# Patient Record
Sex: Female | Born: 1950 | Race: Black or African American | Hispanic: No | Marital: Married | Smoking: Never smoker
Health system: Southern US, Community
[De-identification: ages and names within clinical notes are randomized; demographics above are authoritative.]

## PROBLEM LIST (undated history)

## (undated) DIAGNOSIS — J40 Bronchitis, not specified as acute or chronic: Secondary | ICD-10-CM

## (undated) DIAGNOSIS — M1990 Unspecified osteoarthritis, unspecified site: Secondary | ICD-10-CM

## (undated) HISTORY — PX: MOUTH SURGERY: SHX715

## (undated) HISTORY — DX: Unspecified osteoarthritis, unspecified site: M19.90

---

## 2005-06-09 ENCOUNTER — Encounter: Admission: RE | Admit: 2005-06-09 | Discharge: 2005-06-09 | Payer: Self-pay | Admitting: Family Medicine

## 2010-07-24 ENCOUNTER — Encounter
Admission: RE | Admit: 2010-07-24 | Discharge: 2010-07-24 | Payer: Self-pay | Source: Home / Self Care | Attending: Family Medicine | Admitting: Family Medicine

## 2011-11-27 ENCOUNTER — Other Ambulatory Visit: Payer: Self-pay | Admitting: Family Medicine

## 2011-11-27 DIAGNOSIS — M79606 Pain in leg, unspecified: Secondary | ICD-10-CM

## 2011-11-27 DIAGNOSIS — M549 Dorsalgia, unspecified: Secondary | ICD-10-CM

## 2011-12-02 ENCOUNTER — Other Ambulatory Visit: Payer: Self-pay

## 2011-12-04 ENCOUNTER — Ambulatory Visit
Admission: RE | Admit: 2011-12-04 | Discharge: 2011-12-04 | Disposition: A | Discharge status: Home / Self Care | Source: Ambulatory Visit | Attending: Family Medicine | Admitting: Family Medicine

## 2011-12-04 DIAGNOSIS — M79606 Pain in leg, unspecified: Secondary | ICD-10-CM

## 2011-12-04 DIAGNOSIS — M549 Dorsalgia, unspecified: Secondary | ICD-10-CM

## 2011-12-06 ENCOUNTER — Other Ambulatory Visit: Payer: Self-pay

## 2014-10-18 DIAGNOSIS — I10 Essential (primary) hypertension: Secondary | ICD-10-CM | POA: Insufficient documentation

## 2016-01-28 ENCOUNTER — Encounter (HOSPITAL_BASED_OUTPATIENT_CLINIC_OR_DEPARTMENT_OTHER): Payer: Self-pay | Admitting: *Deleted

## 2016-01-28 ENCOUNTER — Emergency Department (HOSPITAL_BASED_OUTPATIENT_CLINIC_OR_DEPARTMENT_OTHER)
Admission: EM | Admit: 2016-01-28 | Discharge: 2016-01-28 | Disposition: A | Discharge status: Home / Self Care | Payer: Medicare HMO | Attending: Emergency Medicine | Admitting: Emergency Medicine

## 2016-01-28 DIAGNOSIS — B029 Zoster without complications: Secondary | ICD-10-CM | POA: Diagnosis not present

## 2016-01-28 DIAGNOSIS — Z79899 Other long term (current) drug therapy: Secondary | ICD-10-CM | POA: Diagnosis not present

## 2016-01-28 DIAGNOSIS — M791 Myalgia: Secondary | ICD-10-CM | POA: Insufficient documentation

## 2016-01-28 DIAGNOSIS — R21 Rash and other nonspecific skin eruption: Secondary | ICD-10-CM | POA: Diagnosis present

## 2016-01-28 HISTORY — DX: Bronchitis, not specified as acute or chronic: J40

## 2016-01-28 MED ORDER — ACYCLOVIR 800 MG PO TABS: 800.0000 mg | ORAL_TABLET | Freq: Every day | ORAL | 0 refills | Status: DC

## 2016-01-28 MED ORDER — HYDROCODONE-ACETAMINOPHEN 5-325 MG PO TABS
2.0000 | ORAL_TABLET | Freq: Once | ORAL | Status: AC
  Administered 2016-01-28: 2 via ORAL
  Filled 2016-01-28: qty 2

## 2016-01-28 MED ORDER — HYDROCODONE-ACETAMINOPHEN 5-325 MG PO TABS: 1.0000 | ORAL_TABLET | Freq: Four times a day (QID) | ORAL | 0 refills | Status: DC | PRN

## 2016-01-28 NOTE — ED Provider Notes (Signed)
[facility] Provider Note   CSN: [id number] Arrival date & time: 01/28/16  1556  First Provider Contact:  First MD Initiated Contact with Patient 01/28/16 1652        By signing my name below, I, Doreatha Martin, attest that this documentation has been prepared under the direction and in the presence of  Donovan Gatchel, PA-C. Electronically Signed: Doreatha Martin, ED Scribe. 01/28/16. 4:58 PM.    History   Chief Complaint Chief Complaint  Patient presents with  . Rash    HPI Ashley Wu is a 65 y.o. female who presents to the Emergency Department complaining of a moderately pruritic and painful rash to the buttock and left leg onset 3 days ago. Pt states she developed pain before the onset of rash. She reports that she has applied hydrocortisone cream with relief of itching, but no relief of pain. She also reports she has tried Meloxicam with some relief of pain. No known sick contacts with similar symptoms. No new soaps, lotions, detergents, foods, animals, plants, medications. She denies fever, chills, nausea, emesis, additional complaints.    The history is provided by the patient. No language interpreter was used.    Past Medical History:  Diagnosis Date  . Bronchitis     There are no active problems to display for this patient.   Past Surgical History:  Procedure Laterality Date  . MOUTH SURGERY      OB History    No data available       Home Medications    Prior to Admission medications   Medication Sig Start Date End Date Taking? Authorizing Provider  albuterol (PROVENTIL HFA;VENTOLIN HFA) 108 (90 Base) MCG/ACT inhaler Inhale 2 puffs into the lungs every 6 (six) hours as needed for wheezing or shortness of breath.   Yes Historical Provider, MD  amLODipine (NORVASC) 2.5 MG tablet Take 2.5 mg by mouth daily.   Yes Historical Provider, MD  budesonide-formoterol (SYMBICORT) 160-4.5 MCG/ACT inhaler Inhale 2 puffs into the lungs 2 (two) times  daily.   Yes Historical Provider, MD    Family History No family history on file.  Social History Social History  Substance Use Topics  . Smoking status: Never Smoker  . Smokeless tobacco: Never Used  . Alcohol use Yes     Allergies   Advil [ibuprofen]; Aspirin; Baker's yeast [yeast]; and Orange fruit [citrus]   Review of Systems Review of Systems  Constitutional: Negative for chills and fever.  Gastrointestinal: Negative for nausea and vomiting.  Musculoskeletal: Positive for myalgias.  Skin: Positive for rash.    Physical Exam Updated Vital Signs BP 140/88 (BP Location: Left Arm)   Pulse 89   Temp 98.3 F (36.8 C) (Oral)   Resp 18   Ht  (1.676 m)   Wt 169 lb (76.7 kg)   SpO2 100%   BMI 27.28 kg/m   Physical Exam  Constitutional: She appears well-developed and well-nourished.  HENT:  Head: Normocephalic.  Eyes: Conjunctivae are normal.  Cardiovascular: Normal rate, regular rhythm and normal heart sounds.   No murmur heard. Pulmonary/Chest: Effort normal and breath sounds normal. No respiratory distress. She has no wheezes. She has no rales.  Lungs CTA bilaterally.   Musculoskeletal: Normal range of motion.  Neurological: She is alert.  Skin: Skin is warm and dry. Rash noted.  There is a rash to the left medial leg that wraps around from the left buttock.  Rash is erythematous and vesicular in a dermatomal pattern.  Psychiatric: She has a normal mood and affect. Her behavior is normal.  Nursing note and vitals reviewed.    ED Treatments / Results  Labs (all labs ordered are listed, but only abnormal results are displayed) Labs Reviewed - No data to display  EKG  EKG Interpretation None       Radiology No results found.  Procedures Procedures (including critical care time)  DIAGNOSTIC STUDIES: Oxygen Saturation is 100% on RA, normal by my interpretation.    COORDINATION OF CARE: 4:56 PM Discussed treatment plan with pt at bedside  which includes Acyclovir and pt agreed to plan.    Medications Ordered in ED Medications - No data to display   Initial Impression / Assessment and Plan / ED Course  I have reviewed the triage vital signs and the nursing notes.  Pertinent labs & imaging results that were available during my care of the patient were reviewed by me and considered in my medical decision making (see chart for details).  Clinical Course    Ashley Wu presents to the ED for evaluation of rash to the left leg.  Rash appears to be in a dermatomal pattern. Appearance most consistent with Shingles.   Patient will be sent home with Acyclovir and pain medication. Patient advised to follow up with PCP. Patient appears stable for discharge at this time. Return precautions discussed and outlined in discharge paperwork. Patient is agreeable to plan.     Final Clinical Impressions(s) / ED Diagnoses   Final diagnoses:  None    New Prescriptions New Prescriptions   No medications on file   I personally performed the services described in this documentation, which was scribed in my presence. The recorded information has been reviewed and is accurate.    Santiago GladHeather Deletha Jaffee, PA-C 01/30/16 2149    Lavera Guiseana Duo Liu, MD 01/31/16 2039

## 2016-01-28 NOTE — ED Triage Notes (Signed)
Patient states she developed lower back pain three days ago.  Shortly after, developed a rash on her right buttock, with radiation down the inner thigh to her ankle.  Describes as itchy initially, now is very painful.  Was told she had shingles.

## 2016-01-28 NOTE — ED Notes (Signed)
PA with pt.

## 2016-04-15 ENCOUNTER — Telehealth (INDEPENDENT_AMBULATORY_CARE_PROVIDER_SITE_OTHER): Payer: Self-pay | Admitting: Orthopedic Surgery

## 2016-04-15 NOTE — Telephone Encounter (Signed)
Patient is requesting a RX for pain medication.  [facility]:  [facility] [address], [place], [place]

## 2016-04-16 ENCOUNTER — Ambulatory Visit (INDEPENDENT_AMBULATORY_CARE_PROVIDER_SITE_OTHER): Payer: Medicare HMO

## 2016-04-16 ENCOUNTER — Encounter (INDEPENDENT_AMBULATORY_CARE_PROVIDER_SITE_OTHER): Payer: Self-pay | Admitting: Orthopedic Surgery

## 2016-04-16 ENCOUNTER — Ambulatory Visit (INDEPENDENT_AMBULATORY_CARE_PROVIDER_SITE_OTHER): Payer: Medicare HMO | Admitting: Orthopedic Surgery

## 2016-04-16 VITALS — BP 115/70 | HR 94 | Temp 97.7°F | Ht 64.0 in | Wt 169.0 lb

## 2016-04-16 DIAGNOSIS — M25562 Pain in left knee: Secondary | ICD-10-CM

## 2016-04-16 MED ORDER — METHOCARBAMOL 500 MG PO TABS: 500.0000 mg | ORAL_TABLET | Freq: Three times a day (TID) | ORAL | 1 refills | Status: DC | PRN

## 2016-04-16 MED ORDER — LIDOCAINE HCL 1 % IJ SOLN
5.0000 mL | INTRAMUSCULAR | Status: AC | PRN
  Administered 2016-04-16: 5 mL

## 2016-04-16 MED ORDER — METHYLPREDNISOLONE ACETATE 40 MG/ML IJ SUSP
40.0000 mg | INTRAMUSCULAR | Status: AC | PRN
  Administered 2016-04-16: 40 mg via INTRA_ARTICULAR

## 2016-04-16 MED ORDER — BUPIVACAINE HCL 0.25 % IJ SOLN
4.0000 mL | INTRAMUSCULAR | Status: AC | PRN
  Administered 2016-04-16: 4 mL via INTRA_ARTICULAR

## 2016-04-16 MED ORDER — OXAPROZIN 600 MG PO TABS: 600.0000 mg | ORAL_TABLET | Freq: Every day | ORAL | 1 refills | Status: DC

## 2016-04-16 NOTE — Progress Notes (Signed)
Office Visit Note  Ashley Mayotte, MD PCP [facility]  Haidynn is a 65 year old patient with long history of left knee pain.  She reports pain globally around the knee with occasional locking and giving way.  She has been seen in the past by Dr. Jorge Wu in an injection to give her relief.  She hasn't been seen since last year.  Plan at that time was observation with possible injection and/or MRI scanning if her symptoms persisted.  She is currently symptomatic.  Denies any fever or chills. Patient complains of left knee pain, anterior and posterior.  NKI, Onset x 3 weeks. Gives way on her, difficulty with stairs, cannot sleep well.  Maybe 1-2 hrs per night.  Tightness/swelling.  Took daypro and robaxin with no relief, even tried gabapentin that she had for shingles in this left knee- shingles was dx August 4,2017.  This is resolved per patient.     Patient: Ashley Wu           Date of Birth: [birth year]           MRN: [id number] Visit Date: 04/16/2016              Requested by: No referring provider defined for this encounter. PCP:   Assessment & Plan: Visit Diagnoses:  1. Acute pain of left knee     Plan: Impression is left knee effusion with patellofemoral arthritis plan is aspiration injection today we did get about 5 or 6 mL at which is negligible real issue with her is whether not she has something this arthroscopically Ashley Wu and her knee or if this pain and effusion is primarily from patellofemoral arthritis landing is to do the injection try Robaxin and Daypro which is sent to the pharmacy today next step will be MRI scanning which will be ordered if she wants to get that done if these interventions don't help.  She'll call for that next step will see her back as needed  Follow-Up Instructions: Return if symptoms worsen or fail to improve.   Orders:  Orders Placed This Encounter  Procedures  . Large Joint Injection/Arthrocentesis  . XR KNEE 3 VIEW LEFT    Meds ordered this encounter  Medications  . methocarbamol (ROBAXIN) 500 MG tablet    Sig: Take 1 tablet (500 mg total) by mouth every 8 (eight) hours as needed for muscle spasms.    Dispense:  30 tablet    Refill:  1  . oxaprozin (DAYPRO) 600 MG tablet    Sig: Take 1 tablet (600 mg total) by mouth daily.    Dispense:  30 tablet    Refill:  1      Procedures: Large Joint Inj Date/Time: 04/16/2016 4:41 PM Performed by: Ashley Wu Authorized by: Ashley Wu   Consent Given by:  Patient Site marked: the procedure site was marked   Timeout: prior to procedure the correct patient, procedure, and site was verified   Indications:  Pain, joint swelling and diagnostic evaluation Location:  Knee Site:  L knee Prep: patient was prepped and draped in usual sterile fashion   Needle Size:  18 G Needle Length:  1.5 inches Approach:  Superolateral Ultrasound Guidance: No   Fluoroscopic Guidance: No  no Medications:  4 mL bupivacaine 0.25 %; 5 mL lidocaine 1 %; 40 mg methylPREDNISolone acetate 40 MG/ML Aspiration Attempted: Yes   Aspirate amount (mL):  5 Patient tolerance:  Patient tolerated the procedure well with no immediate complications  Clinical Data: No additional findings.   Subjective: Chief Complaint  Patient presents with  . Left Knee - Pain, Joint Swelling    HPI  Review of Systems all systems reviewed and negative except as those that relate to history of present illness denies any fever and chills   Objective: Vital Signs: BP 115/70   Pulse 94   Temp 97.7 F (36.5 C)   Ht 5\' 4"  (1.626 m)   Wt 169 lb (76.7 kg)   BMI 29.01 kg/m   Physical Exam  Constitutional: She appears well-developed and well-nourished.  HENT:  Head: Normocephalic.  Eyes: EOM are normal.  Neck: Normal range of motion.  Cardiovascular: Normal rate.   Pulmonary/Chest: Effort normal.  Neurological: She is alert.  Skin: Skin is warm.  Psychiatric: She has a  normal mood and affect.   Ortho Exam Left knee is examined she has excellent range of motion trace effusion no warmth collateral crucial ligaments are stable pedal pulses palpable no groin pain with internal/external rotation leg no nerve root tension signs she does have patellofemoral crepitus Specialty Comments:  No specialty comments available.  Imaging: Xr Knee 3 View Left  Result Date: 04/16/2016 3 views left knee order and obtained shows maintenance of the joint space medially and laterally but significant patella femoral wear is present.  No other bony lesions present within the femur or tibia alignment is slight varus    PMFS History: There are no active problems to display for this patient.  Past Medical History:  Diagnosis Date  . Arthritis   . Bronchitis   . Osteoarthritis     Family History  Problem Relation Age of Onset  . Cancer Father     Past Surgical History:  Procedure Laterality Date  . MOUTH SURGERY     Social History   Occupational History  . Not on file.   Social History Main Topics  . Smoking status: Never Smoker  . Smokeless tobacco: Never Used  . Alcohol use Yes  . Drug use: No  . Sexual activity: Not on file

## 2016-04-16 NOTE — Telephone Encounter (Signed)
IC pt and advised, workedin this afternoon to see Ashley Wu, left knee pain

## 2016-04-26 ENCOUNTER — Encounter (INDEPENDENT_AMBULATORY_CARE_PROVIDER_SITE_OTHER): Payer: Self-pay | Admitting: Orthopedic Surgery

## 2016-04-26 ENCOUNTER — Ambulatory Visit (INDEPENDENT_AMBULATORY_CARE_PROVIDER_SITE_OTHER): Payer: Medicare HMO | Admitting: Orthopedic Surgery

## 2016-04-26 DIAGNOSIS — M25562 Pain in left knee: Secondary | ICD-10-CM | POA: Diagnosis not present

## 2016-04-26 DIAGNOSIS — G8929 Other chronic pain: Secondary | ICD-10-CM

## 2016-04-26 NOTE — Progress Notes (Signed)
   Office Visit Note   Patient: Ashley Wu           Date of Birth: 04-30-1951           MRN: [id number] Visit Date: 04/26/2016 Requested by: No referring provider defined for this encounter. PCP: No PCP Per Patient  Subjective: Chief Complaint  Patient presents with  . Left Knee - Pain, Follow-up    HPI Ashley Wu 65 year old patient with left knee pain.  Been ongoing for several years.  Taking Daypro and Robaxin.  She's had minimal improvement.  She did have a shin without break in August.  Been hurting her since then.  She has had injections in the past.  Has fairly minimal arthritis in the knee on plain radiographs.  His not have a family history of gout.              Review of Systems all systems reviewed negative as they relate to the chief complaint the fevers or chills   Assessment & Plan: Visit Diagnoses:  1. Chronic pain of left knee     Plan: Left knee pain and effusion global pain no more arthritis on plain radiograph radiographs failure of conservative management with injections and and oral anti-inflammatories.  Need MRI scan to determine if this is a problem that can be treated arthroscopically or she will need a bigger procedure such as knee replacement  Follow-Up Instructions: Return for after MRI.   Orders:  Orders Placed This Encounter  Procedures  . MR Knee Left w/o contrast   No orders of the defined types were placed in this encounter.     Procedures: No procedures performed   Clinical Data: No additional findings.  Objective: Vital Signs: There were no vitals taken for this visit.  Physical Exam  Constitutional: She appears well-developed.  HENT:  Head: Normocephalic.  Eyes: EOM are normal.  Neck: Normal range of motion.  Cardiovascular: Normal rate.   Pulmonary/Chest: Effort normal.  Neurological: She is alert.  Skin: Skin is warm.  Psychiatric: She has a normal mood and affect.    Ortho Exam examination the left knee  demonstrates effusion full range of motion intact since mechanism mild patella femoral crepitus" crucial ligaments palpable pedal pulses no groin pain interlocks rotation leg no other masses lymph adenopathy or skin vision left knee region right knee has no effusion and similar exam  Specialty Comments:  No specialty comments available.  Imaging: No results found.   PMFS History: There are no active problems to display for this patient.  Past Medical History:  Diagnosis Date  . Arthritis   . Bronchitis   . Osteoarthritis     Family History  Problem Relation Age of Onset  . Cancer Father     Past Surgical History:  Procedure Laterality Date  . MOUTH SURGERY     Social History   Occupational History  . Not on file.   Social History Main Topics  . Smoking status: Never Smoker  . Smokeless tobacco: Never Used  . Alcohol use Yes  . Drug use: No  . Sexual activity: Not on file

## 2016-05-11 ENCOUNTER — Ambulatory Visit
Admission: RE | Admit: 2016-05-11 | Discharge: 2016-05-11 | Disposition: A | Discharge status: Home / Self Care | Payer: Medicare HMO | Source: Ambulatory Visit | Attending: Orthopedic Surgery | Admitting: Orthopedic Surgery

## 2016-05-11 DIAGNOSIS — G8929 Other chronic pain: Secondary | ICD-10-CM

## 2016-05-11 DIAGNOSIS — M25562 Pain in left knee: Principal | ICD-10-CM

## 2016-05-15 ENCOUNTER — Encounter (INDEPENDENT_AMBULATORY_CARE_PROVIDER_SITE_OTHER): Payer: Self-pay | Admitting: Orthopedic Surgery

## 2016-05-15 ENCOUNTER — Ambulatory Visit (INDEPENDENT_AMBULATORY_CARE_PROVIDER_SITE_OTHER): Payer: Medicare HMO | Admitting: Orthopedic Surgery

## 2016-05-15 DIAGNOSIS — M1712 Unilateral primary osteoarthritis, left knee: Secondary | ICD-10-CM

## 2016-05-15 DIAGNOSIS — S83262D Peripheral tear of lateral meniscus, current injury, left knee, subsequent encounter: Secondary | ICD-10-CM | POA: Diagnosis not present

## 2016-05-15 MED ORDER — HYDROCODONE-ACETAMINOPHEN 5-325 MG PO TABS: 1.0000 | ORAL_TABLET | Freq: Every day | ORAL | 0 refills | Status: DC

## 2016-05-15 NOTE — Progress Notes (Signed)
   Office Visit Note   Patient: Ashley DawesDarlene Wilson-Mitchell           Date of Birth: 12/17/1950           MRN: [id number] Visit Date: 05/15/2016 Requested by: No referring provider defined for this encounter. PCP: No PCP Per Patient  Subjective: Chief Complaint  Patient presents with  . Left Knee - Pain    HPI Agustin CreeDarlene is a 65 year old female with left knee pain here to follow-up MRI scan.  She's used Daypro and Robaxin without help.  She is currently not working.  Tried ice and heat which has not helped.  She tried tramadol, Tylenol 3 that also has not helped.  Describes pain primarily in the anterior aspect of the knee radiating up the thigh.  Denies any groin pain.              Review of Systems All systems reviewed are negative as they relate to the chief complaint within the history of present illness.  Patient denies  fevers or chills.    Assessment & Plan: Visit Diagnoses:  1. Primary osteoarthritis of left knee     Plan: Impression is left knee pain with lateral meniscal tear mild arthritis in the lateral compartment plan 1 tablet prescription for pain medicine given she's going to consider arthroscopic intervention at the beginning of the year.  Nonweightbearing quad straightening exercises encouraged.  She'll follow up in beginning of the year if she wants to consider arthroscopic intervention.  I did tell her that are not really doing chronic long-term pain medicine.  Follow-Up Instructions: No Follow-up on file.   Orders:  No orders of the defined types were placed in this encounter.  No orders of the defined types were placed in this encounter.     Procedures: No procedures performed   Clinical Data: No additional findings.  Objective: Vital Signs: There were no vitals taken for this visit.  Physical Exam  Constitutional: She appears well-developed.  HENT:  Head: Normocephalic.  Eyes: EOM are normal.  Neck: Normal range of motion.  Cardiovascular: Normal  rate.   Pulmonary/Chest: Effort normal.  Neurological: She is alert.  Skin: Skin is warm.  Psychiatric: She has a normal mood and affect.    Ortho Exam examination of the left knee demonstrates good range of motion moderate effusion stable collateral crucial ligaments palpable pedal pulses no groin pain with internal/external rotation of the leg intact extensor mechanism  Specialty Comments:  No specialty comments available.  Imaging: No results found.   PMFS History: There are no active problems to display for this patient.  Past Medical History:  Diagnosis Date  . Arthritis   . Bronchitis   . Osteoarthritis     Family History  Problem Relation Age of Onset  . Cancer Father     Past Surgical History:  Procedure Laterality Date  . MOUTH SURGERY     Social History   Occupational History  . Not on file.   Social History Main Topics  . Smoking status: Never Smoker  . Smokeless tobacco: Never Used  . Alcohol use Yes  . Drug use: No  . Sexual activity: Not on file

## 2016-08-07 ENCOUNTER — Emergency Department (HOSPITAL_BASED_OUTPATIENT_CLINIC_OR_DEPARTMENT_OTHER)
Admission: EM | Admit: 2016-08-07 | Discharge: 2016-08-07 | Disposition: A | Discharge status: Home / Self Care | Payer: Medicare HMO | Attending: Emergency Medicine | Admitting: Emergency Medicine

## 2016-08-07 ENCOUNTER — Encounter (HOSPITAL_BASED_OUTPATIENT_CLINIC_OR_DEPARTMENT_OTHER): Payer: Self-pay

## 2016-08-07 ENCOUNTER — Emergency Department (HOSPITAL_BASED_OUTPATIENT_CLINIC_OR_DEPARTMENT_OTHER): Payer: Medicare HMO

## 2016-08-07 DIAGNOSIS — J9801 Acute bronchospasm: Secondary | ICD-10-CM

## 2016-08-07 DIAGNOSIS — J069 Acute upper respiratory infection, unspecified: Secondary | ICD-10-CM | POA: Diagnosis not present

## 2016-08-07 DIAGNOSIS — Z79899 Other long term (current) drug therapy: Secondary | ICD-10-CM | POA: Diagnosis not present

## 2016-08-07 DIAGNOSIS — R05 Cough: Secondary | ICD-10-CM | POA: Diagnosis present

## 2016-08-07 MED ORDER — IPRATROPIUM-ALBUTEROL 0.5-2.5 (3) MG/3ML IN SOLN
RESPIRATORY_TRACT | Status: AC
  Administered 2016-08-07: 3 mL
  Filled 2016-08-07: qty 3

## 2016-08-07 MED ORDER — PREDNISONE 20 MG PO TABS
ORAL_TABLET | ORAL | 0 refills | Status: DC
Start: 2016-08-07 — End: 2017-06-15

## 2016-08-07 MED ORDER — ALBUTEROL SULFATE (2.5 MG/3ML) 0.083% IN NEBU
INHALATION_SOLUTION | RESPIRATORY_TRACT | Status: AC
  Administered 2016-08-07: 2.5 mg
  Filled 2016-08-07: qty 3

## 2016-08-07 MED ORDER — METHYLPREDNISOLONE SODIUM SUCC 125 MG IJ SOLR
125.0000 mg | Freq: Once | INTRAMUSCULAR | Status: AC
  Administered 2016-08-07: 125 mg via INTRAMUSCULAR
  Filled 2016-08-07: qty 2

## 2016-08-07 MED ORDER — LORATADINE 10 MG PO TABS: 10.0000 mg | ORAL_TABLET | Freq: Every day | ORAL | 0 refills | Status: DC

## 2016-08-07 MED FILL — predniSONE 20 MG TABS: 20 | 5 days supply | Qty: 11 | Fill #0

## 2016-08-07 MED FILL — LORATADINE 10 MG TABLET: 10 | 100 days supply | Qty: 100 | Fill #0

## 2016-08-07 NOTE — ED Triage Notes (Addendum)
C/o prod cough, SOB x 3-4 days-dx with sinus infection rx amoxil-NAD-steady gait-pt in no resp distress-RT reports wheezing heard-plan for neb in tx area

## 2016-08-07 NOTE — ED Provider Notes (Signed)
[facility]   CSN: [id number] Arrival date & time: 08/07/16  1231     History   Chief Complaint Chief Complaint  Patient presents with  . Cough    HPI Ashley Wu is a 66 y.o. female.  HPI Patient presents with shortness of breath, wheezing and cough productive of clear sputum for the past 3 days. States she's had low-grade fever. Recently treated for sinus infection. Continues to have nasal congestion but denies any sinus pressure. Mild sore throat. No lower extremity swelling or pain. No chest pain. She's been using her inhaler at home with little relief. Past Medical History:  Diagnosis Date  . Arthritis   . Bronchitis   . Osteoarthritis     There are no active problems to display for this patient.   Past Surgical History:  Procedure Laterality Date  . MOUTH SURGERY      OB History    No data available       Home Medications    Prior to Admission medications   Medication Sig Start Date End Date Taking? Authorizing Provider  fluticasone (FLONASE) 50 MCG/ACT nasal spray Place into both nostrils daily.   Yes Historical Provider, MD  albuterol (PROVENTIL HFA;VENTOLIN HFA) 108 (90 Base) MCG/ACT inhaler Inhale 2 puffs into the lungs every 6 (six) hours as needed for wheezing or shortness of breath.    Historical Provider, MD  amLODipine (NORVASC) 2.5 MG tablet Take 2.5 mg by mouth daily.    Historical Provider, MD  budesonide-formoterol (SYMBICORT) 160-4.5 MCG/ACT inhaler Inhale 2 puffs into the lungs 2 (two) times daily.    Historical Provider, MD  HYDROcodone-acetaminophen (NORCO/VICODIN) 5-325 MG tablet Take 1 tablet by mouth daily. 05/15/16   Cammy Copa, MD  loratadine (CLARITIN) 10 MG tablet Take 1 tablet (10 mg total) by mouth daily. 08/07/16   Loren Racer, MD  methocarbamol (ROBAXIN) 500 MG tablet Take 1 tablet (500 mg total) by mouth every 8 (eight) hours as needed for muscle spasms. 04/16/16   Cammy Copa, MD  oxaprozin (DAYPRO) 600 MG tablet Take 1 tablet (600 mg total) by mouth daily. 04/16/16   Cammy Copa, MD  predniSONE (DELTASONE) 20 MG tablet 3 tabs po day one, then 2 po daily x 4 days 08/07/16   Loren Racer, MD    Family History Family History  Problem Relation Age of Onset  . Cancer Father     Social History Social History  Substance Use Topics  . Smoking status: Never Smoker  . Smokeless tobacco: Never Used  . Alcohol use Yes     Comment: occ     Allergies   Advil [ibuprofen]; Aspirin; Baker's yeast [yeast]; and Orange fruit [citrus]   Review of Systems Review of Systems  Constitutional: Positive for fatigue and fever. Negative for chills.  HENT: Positive for congestion, rhinorrhea and sore throat. Negative for sinus pressure.   Respiratory: Positive for cough, shortness of breath and wheezing.   Cardiovascular: Negative for chest pain, palpitations and leg swelling.  Gastrointestinal: Negative for abdominal pain, constipation, diarrhea, nausea and vomiting.  Genitourinary: Negative for dysuria, flank pain and frequency.  Musculoskeletal: Negative for back pain, myalgias, neck pain and neck stiffness.  Skin: Negative for rash and wound.  Neurological: Negative for dizziness, weakness, light-headedness, numbness and headaches.  All other systems reviewed and are negative.    Physical Exam Updated Vital Signs BP 159/87 (BP Location: Left Arm)   Pulse 101   Temp 99.2  F (37.3 C) (Oral)   Resp 20   Ht 5\' 6"  (1.676 m)   Wt 168 lb (76.2 kg)   SpO2 98%   BMI 27.12 kg/m   Physical Exam  Constitutional: She is oriented to person, place, and time. She appears well-developed and well-nourished. No distress.  HENT:  Head: Normocephalic and atraumatic.  Mouth/Throat: Oropharynx is clear and moist.  Bilateral nasal mucosal edema. Mildly erythematous oropharynx.  Eyes: EOM are normal. Pupils are equal, round, and reactive to light.  Neck: Normal  range of motion. Neck supple.  Cardiovascular: Normal rate and regular rhythm.   Pulmonary/Chest: Effort normal. No stridor. She has wheezes (Expiratory wheezing in all lung fields).  Abdominal: Soft. Bowel sounds are normal. There is no tenderness. There is no rebound and no guarding.  Musculoskeletal: Normal range of motion. She exhibits no edema or tenderness.  No lower extremity swelling or asymmetry.  Neurological: She is alert and oriented to person, place, and time.  Moves all extremities without deficit. Sensation fully intact.  Skin: Skin is warm and dry. Capillary refill takes less than 2 seconds. No rash noted. No erythema.  Psychiatric: She has a normal mood and affect. Her behavior is normal.  Nursing note and vitals reviewed.    ED Treatments / Results  Labs (all labs ordered are listed, but only abnormal results are displayed) Labs Reviewed - No data to display  EKG  EKG Interpretation None       Radiology Dg Chest 2 View  Result Date: 08/07/2016 CLINICAL DATA:  Three days of intermittent shortness of breath with cough and chest congestion and low-grade fever. The patient also reports right lower back and abdominal discomfort. History of hypertension, bronchitis, nonsmoker. EXAM: CHEST  2 VIEW COMPARISON:  None in PACs FINDINGS: The lungs are mildly hyperinflated with hemidiaphragm flattening. There is no focal infiltrate. The interstitial markings are coarse. The heart and pulmonary vascularity are normal. The mediastinum is normal in width. There is mild multilevel degenerative disc disease of the thoracic spine with gentle S shaped thoracolumbar scoliosis. IMPRESSION: Chronic bronchitic changes. No pneumonia, CHF, nor other acute cardiopulmonary abnormality. Electronically Signed   By: Kriston Mckinnie  [place] M.D.   On: 08/07/2016 13:20    Procedures Procedures (including critical care time)  Medications Ordered in ED Medications  albuterol (PROVENTIL) (2.5 MG/3ML) 0.083%  nebulizer solution (2.5 mg  Given 08/07/16 1249)  ipratropium-albuterol (DUONEB) 0.5-2.5 (3) MG/3ML nebulizer solution (3 mLs  Given 08/07/16 1249)  methylPREDNISolone sodium succinate (SOLU-MEDROL) 125 mg/2 mL injection 125 mg (125 mg Intramuscular Given 08/07/16 1319)     Initial Impression / Assessment and Plan / ED Course  I have reviewed the triage vital signs and the nursing notes.  Pertinent labs & imaging results that were available during my care of the patient were reviewed by me and considered in my medical decision making (see chart for details).     Patient states for much better after breathing treatment. Chest x-ray without evidence of pneumonia. Wheezing has resolved on repeat exam. We'll discharge home with short course of steroids and started on Claritin. Advised to follow-up with her primary physician. Return precautions given.  Final Clinical Impressions(s) / ED Diagnoses   Final diagnoses:  Upper respiratory tract infection, unspecified type  Bronchospasm    New Prescriptions New Prescriptions   LORATADINE (CLARITIN) 10 MG TABLET    Take 1 tablet (10 mg total) by mouth daily.   PREDNISONE (DELTASONE) 20 MG TABLET    3  tabs po day one, then 2 po daily x 4 days     Loren Racer, MD 08/07/16 [phone]

## 2017-06-15 ENCOUNTER — Emergency Department (HOSPITAL_BASED_OUTPATIENT_CLINIC_OR_DEPARTMENT_OTHER)
Admission: EM | Admit: 2017-06-15 | Discharge: 2017-06-15 | Disposition: A | Discharge status: Home / Self Care | Payer: Medicare HMO | Attending: Emergency Medicine | Admitting: Emergency Medicine

## 2017-06-15 ENCOUNTER — Other Ambulatory Visit: Payer: Self-pay

## 2017-06-15 ENCOUNTER — Emergency Department (HOSPITAL_BASED_OUTPATIENT_CLINIC_OR_DEPARTMENT_OTHER): Payer: Medicare HMO

## 2017-06-15 ENCOUNTER — Encounter (HOSPITAL_BASED_OUTPATIENT_CLINIC_OR_DEPARTMENT_OTHER): Payer: Self-pay | Admitting: Emergency Medicine

## 2017-06-15 DIAGNOSIS — Z79899 Other long term (current) drug therapy: Secondary | ICD-10-CM | POA: Insufficient documentation

## 2017-06-15 DIAGNOSIS — R062 Wheezing: Secondary | ICD-10-CM | POA: Diagnosis not present

## 2017-06-15 DIAGNOSIS — R059 Cough, unspecified: Secondary | ICD-10-CM

## 2017-06-15 DIAGNOSIS — R05 Cough: Secondary | ICD-10-CM | POA: Diagnosis present

## 2017-06-15 MED ORDER — PREDNISONE 50 MG PO TABS
60.0000 mg | ORAL_TABLET | Freq: Once | ORAL | Status: AC
  Administered 2017-06-15: 60 mg via ORAL
  Filled 2017-06-15: qty 1

## 2017-06-15 MED ORDER — PREDNISONE 20 MG PO TABS: 40.0000 mg | ORAL_TABLET | Freq: Every day | ORAL | 0 refills | Status: AC

## 2017-06-15 MED ORDER — IPRATROPIUM-ALBUTEROL 0.5-2.5 (3) MG/3ML IN SOLN
3.0000 mL | Freq: Once | RESPIRATORY_TRACT | Status: AC
  Administered 2017-06-15: 3 mL via RESPIRATORY_TRACT
  Filled 2017-06-15: qty 3

## 2017-06-15 MED ORDER — IPRATROPIUM-ALBUTEROL 0.5-2.5 (3) MG/3ML IN SOLN
RESPIRATORY_TRACT | Status: AC
  Administered 2017-06-15: 3 mL
  Filled 2017-06-15: qty 3

## 2017-06-15 MED ORDER — ALBUTEROL (5 MG/ML) CONTINUOUS INHALATION SOLN
INHALATION_SOLUTION | RESPIRATORY_TRACT | Status: AC
  Administered 2017-06-15: 2.5 mg
  Filled 2017-06-15: qty 0.5

## 2017-06-15 NOTE — ED Provider Notes (Signed)
[facility] EMERGENCY DEPARTMENT Provider Note   CSN: [id number] Arrival date & time: 06/15/17  1218     History   Chief Complaint Chief Complaint  Patient presents with  . Cough    HPI Ashley Wu is a 66 y.o. female a history of bronchitis presents today for evaluation of cough.  She reports that she has had increased cough for the past month however last night began to have significantly increased shortness of breath.  She reports that she attempted a breathing treatment at home last night, and then again this morning without significant relief.  She denies ever smoking.  She reports that she takes Claritin every day for allergies.  Denies any nasal congestion or sore throat.  No fevers or chills.  Denies any chest pains.  HPI  Past Medical History:  Diagnosis Date  . Arthritis   . Bronchitis   . Osteoarthritis     There are no active problems to display for this patient.   Past Surgical History:  Procedure Laterality Date  . MOUTH SURGERY      OB History    No data available       Home Medications    Prior to Admission medications   Medication Sig Start Date End Date Taking? Authorizing Provider  albuterol (PROVENTIL HFA;VENTOLIN HFA) 108 (90 Base) MCG/ACT inhaler Inhale 2 puffs into the lungs every 6 (six) hours as needed for wheezing or shortness of breath.    [provider]  amLODipine (NORVASC) 2.5 MG tablet Take 2.5 mg by mouth daily.    [provider]  budesonide-formoterol (SYMBICORT) 160-4.5 MCG/ACT inhaler Inhale 2 puffs into the lungs 2 (two) times daily.    [provider]  fluticasone (FLONASE) 50 MCG/ACT nasal spray Place into both nostrils daily.    [provider]  HYDROcodone-acetaminophen (NORCO/VICODIN) 5-325 MG tablet Take 1 tablet by mouth daily. 05/15/16   Cammy Copaean, Gregory Scott, MD  loratadine (CLARITIN) 10 MG tablet Take 1 tablet (10 mg total) by mouth daily. 08/07/16   Loren RacerYelverton,  David, MD  methocarbamol (ROBAXIN) 500 MG tablet Take 1 tablet (500 mg total) by mouth every 8 (eight) hours as needed for muscle spasms. 04/16/16   Cammy Copaean, Gregory Scott, MD  oxaprozin (DAYPRO) 600 MG tablet Take 1 tablet (600 mg total) by mouth daily. 04/16/16   Cammy Copaean, Gregory Scott, MD  predniSONE (DELTASONE) 20 MG tablet Take 2 tablets (40 mg total) by mouth daily for 4 days. 06/16/17 06/20/17  Cristina GongHammond, Elizabeth W, PA-C    Family History Family History  Problem Relation Age of Onset  . Cancer Father     Social History Social History   Tobacco Use  . Smoking status: Never Smoker  . Smokeless tobacco: Never Used  Substance Use Topics  . Alcohol use: Yes    Comment: occ  . Drug use: No     Allergies   Advil [ibuprofen]; Aspirin; Baker's yeast [yeast]; and Orange fruit [citrus]   Review of Systems Review of Systems  Constitutional: Negative for chills and fever.  HENT: Negative for ear pain and sore throat.   Eyes: Negative for pain and visual disturbance.  Respiratory: Positive for cough, shortness of breath and wheezing.   Cardiovascular: Negative for chest pain and palpitations.  Gastrointestinal: Negative for abdominal pain and vomiting.  Genitourinary: Negative for dysuria and hematuria.  Musculoskeletal: Negative for arthralgias and back pain.  Skin: Negative for color change and rash.  Neurological: Negative for seizures and syncope.  All other systems reviewed and are negative.    Physical Exam Updated Vital Signs BP (!) 147/95 (BP Location: Left Arm)   Pulse (!) 102   Temp 98.1 F (36.7 C) (Oral)   Resp (!) 26   Ht 5' 6.5" (1.689 m)   Wt 74.4 kg (164 lb)   SpO2 96%   BMI 26.07 kg/m   Physical Exam  Constitutional: She is oriented to person, place, and time. She appears well-developed and well-nourished. No distress.  HENT:  Head: Normocephalic and atraumatic.  Mouth/Throat: Oropharynx is clear and moist.  Eyes: Conjunctivae are normal.  Neck:  Neck supple.  Cardiovascular: Normal rate and regular rhythm.  No murmur heard. Pulmonary/Chest: Effort normal. No stridor. No respiratory distress. She has wheezes (Diffuse expiratory wheezes throughout all lung fields.  Breath sounds are slightly diminished diffusely.).  Abdominal: Soft. There is no tenderness.  Musculoskeletal: She exhibits no edema.  Neurological: She is alert and oriented to person, place, and time.  Skin: Skin is warm and dry.  Psychiatric: She has a normal mood and affect.  Nursing note and vitals reviewed.    ED Treatments / Results  Labs (all labs ordered are listed, but only abnormal results are displayed) Labs Reviewed - No data to display  EKG  EKG Interpretation None       Radiology Dg Chest 2 View  Result Date: 06/15/2017 CLINICAL DATA:  Cough for 1 day EXAM: CHEST  2 VIEW COMPARISON:  08/07/2016 FINDINGS: Heart and mediastinal contours are within normal limits. No focal opacities or effusions. No acute bony abnormality. Mild chronic peribronchial thickening. IMPRESSION: Chronic bronchitic changes.  No active disease. Electronically Signed   By: Charlett NoseKevin  Dover M.D.   On: 06/15/2017 12:56    Procedures Procedures (including critical care time)  Medications Ordered in ED Medications  albuterol (PROVENTIL, VENTOLIN) (5 MG/ML) 0.5% continuous inhalation solution (2.5 mg  Given 06/15/17 1234)  ipratropium-albuterol (DUONEB) 0.5-2.5 (3) MG/3ML nebulizer solution (3 mLs  Given 06/15/17 1234)  ipratropium-albuterol (DUONEB) 0.5-2.5 (3) MG/3ML nebulizer solution 3 mL (3 mLs Nebulization Given 06/15/17 1307)  predniSONE (DELTASONE) tablet 60 mg (60 mg Oral Given 06/15/17 1307)     Initial Impression / Assessment and Plan / ED Course  I have reviewed the triage vital signs and the nursing notes.  Pertinent labs & imaging results that were available during my care of the patient were reviewed by me and considered in my medical decision making (see chart  for details).  Clinical Course as of Jun 15 1634  Sun Jun 15, 2017  1342 Patient reevaluated after second DuoNeb, she is still having end expiratory wheezes with mild rhonchi.  Airflow is increased.  She reports that she feels like she is breathing at her normal, however I am able to hear her wheezing without use of a stethoscope.  Will ambulate with pulse ox.  [EH]    Clinical Course User Index [EH] Cristina GongHammond, Elizabeth W, PA-C   Ashley Wu presents for evaluation of cough, wheezing, shortness of breath consistent with bronchitis.  Chest x-ray was obtained without acute abnormality.  She improved after DuoNeb's, however still sounded wheezy.  Despite sounding wheezy she reported that she felt well.  She was ambulated in the department and maintained her oxygen saturations.  She was offered additional nebulizer treatments, based on her lung sounds, however declined stating that she feels better.  Was given a dose of prednisone while in the emergency room and given a prescription for 4 more  days of prednisone.  She was given return precautions, and stated her understanding.  She reports that she has a nebulizer machine at home along with adequate albuterol treatments.  She was seen as a shared visit with Dr. Particia Nearing who evaluated the patient and agreed with my plan.   Final Clinical Impressions(s) / ED Diagnoses   Final diagnoses:  Cough  Wheezing    ED Discharge Orders        Ordered    predniSONE (DELTASONE) 20 MG tablet  Daily     06/15/17 1414       Cristina Gong, [place] 06/15/17 1639    Jacalyn Lefevre, MD 06/19/17 1536

## 2017-06-15 NOTE — Discharge Instructions (Signed)
Please follow up with your doctor.  If your symptoms worsen or return then please return to the emergency room.

## 2017-06-15 NOTE — ED Notes (Signed)
Patient ambulated in the hallway with pulse ox - Pulse ox WNL - 96-97

## 2017-06-15 NOTE — ED Triage Notes (Signed)
Patient states that she has had SOB and cough with bronchitis today  - she reports that she has had a cough for the last month

## 2018-02-04 ENCOUNTER — Encounter (INDEPENDENT_AMBULATORY_CARE_PROVIDER_SITE_OTHER): Payer: Self-pay | Admitting: Orthopedic Surgery

## 2018-02-04 ENCOUNTER — Ambulatory Visit (INDEPENDENT_AMBULATORY_CARE_PROVIDER_SITE_OTHER): Payer: Medicare HMO | Admitting: Orthopedic Surgery

## 2018-02-04 ENCOUNTER — Ambulatory Visit (INDEPENDENT_AMBULATORY_CARE_PROVIDER_SITE_OTHER): Payer: Medicare HMO

## 2018-02-04 DIAGNOSIS — M25562 Pain in left knee: Secondary | ICD-10-CM

## 2018-02-04 MED ORDER — METHOCARBAMOL 750 MG PO TABS: 750.0000 mg | ORAL_TABLET | Freq: Two times a day (BID) | ORAL | 0 refills | Status: DC

## 2018-02-04 NOTE — Progress Notes (Signed)
Office Visit Note   Patient: Ashley Wu           Date of Birth: 12/01/1950           MRN: [id number] Visit Date: 02/04/2018 Requested by: Angelica ChessmanAguiar, Rafaela M, MD [address] [place], [place] [id number] PCP: Angelica ChessmanAguiar, Rafaela M, MD  Subjective: Chief Complaint  Patient presents with  . Left Knee - Pain    HPI: Ashley Wu is a patient with left knee pain.  She describes locking and soreness.  Is been flared up for 2-1/2 weeks.  She had been taking Robaxin but does not have any left.  She has a known meniscal tear described in 2017.  Had an aspiration and injection of the knee at that time which helped.  She is also taking Neurontin.  Denies any interval history of injury.              ROS: All systems reviewed are negative as they relate to the chief complaint within the history of present illness.  Patient denies  fevers or chills.   Assessment & Plan: Visit Diagnoses:  1. Left knee pain, unspecified chronicity     Plan: Impression is left knee pain with locking with not much in the way of arthritis on plain radiographs.  Plan is to refill Robaxin and to repeat the aspiration and injection of the left knee.  If this fails to relieve relieve her symptoms we may need to consider arthroscopic intervention.  Follow-Up Instructions: No follow-ups on file.   Orders:  Orders Placed This Encounter  Procedures  . XR Knee 1-2 Views Left   No orders of the defined types were placed in this encounter.     Procedures: No procedures performed   Clinical Data: No additional findings.  Objective: Vital Signs: There were no vitals taken for this visit.  Physical Exam:   Constitutional: Patient appears well-developed HEENT:  Head: Normocephalic Eyes:EOM are normal Neck: Normal range of motion Cardiovascular: Normal rate Pulmonary/chest: Effort normal Neurologic: Patient is alert Skin: Skin is warm Psychiatric: Patient has normal mood and affect    Ortho  Exam: Ortho exam demonstrates full active and passive range of motion of the right knee and left knee.  Mild effusion in the left knee is present with mild medial joint line tenderness.  Collateral cruciate ligaments are stable and range of motion is nearly full.  Pedal pulses palpable.  No other masses lymphadenopathy or skin changes noted in that left knee region.  Specialty Comments:  No specialty comments available.  Imaging: Xr Knee 1-2 Views Left  Result Date: 02/04/2018 AP lateral left knee reviewed.  Joint spaces maintained in all 3 compartments.  No significant malalignment or effusion present.  Only mild degenerative changes noted in the medial and lateral plateau region.    PMFS History: There are no active problems to display for this patient.  Past Medical History:  Diagnosis Date  . Arthritis   . Bronchitis   . Osteoarthritis     Family History  Problem Relation Age of Onset  . Cancer Father     Past Surgical History:  Procedure Laterality Date  . MOUTH SURGERY     Social History   Occupational History  . Not on file  Tobacco Use  . Smoking status: Never Smoker  . Smokeless tobacco: Never Used  Substance and Sexual Activity  . Alcohol use: Yes    Comment: occ  . Drug use: No  . Sexual  activity: Not on file

## 2018-02-05 ENCOUNTER — Telehealth (INDEPENDENT_AMBULATORY_CARE_PROVIDER_SITE_OTHER): Payer: Self-pay

## 2018-02-05 MED ORDER — TIZANIDINE HCL 4 MG PO TABS: 4.0000 mg | ORAL_TABLET | Freq: Three times a day (TID) | ORAL | 0 refills | Status: DC | PRN

## 2018-02-05 NOTE — Telephone Encounter (Signed)
Patients insurance will not cover robaxin because they prefer tizanidine. Ok to fill this instead of robaxin?

## 2018-02-05 NOTE — Telephone Encounter (Signed)
y

## 2018-02-05 NOTE — Addendum Note (Signed)
Addended byPrescott Parma: Samarrah Tranchina on: 02/05/2018 12:41 PM   Modules accepted: Orders

## 2018-02-08 ENCOUNTER — Encounter (INDEPENDENT_AMBULATORY_CARE_PROVIDER_SITE_OTHER): Payer: Self-pay | Admitting: Orthopedic Surgery

## 2018-02-24 ENCOUNTER — Other Ambulatory Visit: Payer: Self-pay

## 2018-02-24 ENCOUNTER — Encounter (HOSPITAL_BASED_OUTPATIENT_CLINIC_OR_DEPARTMENT_OTHER): Payer: Self-pay | Admitting: Emergency Medicine

## 2018-02-24 ENCOUNTER — Emergency Department (HOSPITAL_BASED_OUTPATIENT_CLINIC_OR_DEPARTMENT_OTHER)
Admission: EM | Admit: 2018-02-24 | Discharge: 2018-02-24 | Disposition: A | Discharge status: Home / Self Care | Payer: Medicare HMO | Attending: Emergency Medicine | Admitting: Emergency Medicine

## 2018-02-24 ENCOUNTER — Emergency Department (HOSPITAL_BASED_OUTPATIENT_CLINIC_OR_DEPARTMENT_OTHER): Payer: Medicare HMO

## 2018-02-24 DIAGNOSIS — J181 Lobar pneumonia, unspecified organism: Secondary | ICD-10-CM | POA: Insufficient documentation

## 2018-02-24 DIAGNOSIS — R0602 Shortness of breath: Secondary | ICD-10-CM | POA: Diagnosis present

## 2018-02-24 DIAGNOSIS — J189 Pneumonia, unspecified organism: Secondary | ICD-10-CM

## 2018-02-24 DIAGNOSIS — J40 Bronchitis, not specified as acute or chronic: Secondary | ICD-10-CM | POA: Diagnosis not present

## 2018-02-24 DIAGNOSIS — Z79899 Other long term (current) drug therapy: Secondary | ICD-10-CM | POA: Insufficient documentation

## 2018-02-24 LAB — CBC WITH DIFFERENTIAL/PLATELET
BASOS ABS: 0 10*3/uL (ref 0.0–0.1)
BASOS PCT: 1 %
EOS ABS: 0.2 10*3/uL (ref 0.0–0.7)
Eosinophils Relative: 4 %
HCT: 46.2 % — ABNORMAL HIGH (ref 36.0–46.0)
Hemoglobin: 15.3 g/dL — ABNORMAL HIGH (ref 12.0–15.0)
Lymphocytes Relative: 35 %
Lymphs Abs: 2.2 10*3/uL (ref 0.7–4.0)
MCH: 28.3 pg (ref 26.0–34.0)
MCHC: 33.1 g/dL (ref 30.0–36.0)
MCV: 85.6 fL (ref 78.0–100.0)
Monocytes Absolute: 0.7 10*3/uL (ref 0.1–1.0)
Monocytes Relative: 11 %
Neutro Abs: 3.2 10*3/uL (ref 1.7–7.7)
Neutrophils Relative %: 49 %
PLATELETS: 229 10*3/uL (ref 150–400)
RBC: 5.4 MIL/uL — AB (ref 3.87–5.11)
RDW: 15.1 % (ref 11.5–15.5)
WBC: 6.4 10*3/uL (ref 4.0–10.5)

## 2018-02-24 LAB — COMPREHENSIVE METABOLIC PANEL
ALT: 23 U/L (ref 0–44)
AST: 38 U/L (ref 15–41)
Albumin: 4.3 g/dL (ref 3.5–5.0)
Alkaline Phosphatase: 81 U/L (ref 38–126)
Anion gap: 11 (ref 5–15)
BILIRUBIN TOTAL: 1 mg/dL (ref 0.3–1.2)
BUN: 6 mg/dL — ABNORMAL LOW (ref 8–23)
CO2: 30 mmol/L (ref 22–32)
CREATININE: 0.98 mg/dL (ref 0.44–1.00)
Calcium: 8.8 mg/dL — ABNORMAL LOW (ref 8.9–10.3)
Chloride: 100 mmol/L (ref 98–111)
GFR, EST NON AFRICAN AMERICAN: 59 mL/min — AB (ref >60)
Glucose, Bld: 98 mg/dL (ref 70–99)
POTASSIUM: 3.7 mmol/L (ref 3.5–5.1)
Sodium: 141 mmol/L (ref 135–145)
TOTAL PROTEIN: 8.4 g/dL — AB (ref 6.5–8.1)

## 2018-02-24 LAB — I-STAT CG4 LACTIC ACID, ED
LACTIC ACID, VENOUS: 2.7 mmol/L — AB (ref 0.5–1.9)
Lactic Acid, Venous: 1.41 mmol/L (ref 0.5–1.9)

## 2018-02-24 MED ORDER — ACETAMINOPHEN 500 MG PO TABS
1000.0000 mg | ORAL_TABLET | Freq: Once | ORAL | Status: AC
  Administered 2018-02-24: 1000 mg via ORAL
  Filled 2018-02-24: qty 2

## 2018-02-24 MED ORDER — PREDNISONE 50 MG PO TABS: 50.0000 mg | ORAL_TABLET | Freq: Every day | ORAL | 0 refills | Status: AC

## 2018-02-24 MED ORDER — ALBUTEROL SULFATE (2.5 MG/3ML) 0.083% IN NEBU
2.5000 mg | INHALATION_SOLUTION | Freq: Once | RESPIRATORY_TRACT | Status: AC
  Administered 2018-02-24: 2.5 mg via RESPIRATORY_TRACT
  Filled 2018-02-24: qty 3

## 2018-02-24 MED ORDER — AMOXICILLIN-POT CLAVULANATE 875-125 MG PO TABS: 1.0000 | ORAL_TABLET | Freq: Two times a day (BID) | ORAL | 0 refills | Status: DC

## 2018-02-24 MED ORDER — SODIUM CHLORIDE 0.9 % IV SOLN
INTRAVENOUS | Status: DC | PRN
  Administered 2018-02-24: 250 mL via INTRAVENOUS

## 2018-02-24 MED ORDER — IPRATROPIUM-ALBUTEROL 0.5-2.5 (3) MG/3ML IN SOLN: 3.0000 mL | Freq: Once | RESPIRATORY_TRACT | Status: DC

## 2018-02-24 MED ORDER — SODIUM CHLORIDE 0.9 % IV SOLN
500.0000 mg | INTRAVENOUS | Status: DC
  Administered 2018-02-24: 500 mg via INTRAVENOUS
  Filled 2018-02-24: qty 500

## 2018-02-24 MED ORDER — SODIUM CHLORIDE 0.9 % IV BOLUS (SEPSIS)
1000.0000 mL | Freq: Once | INTRAVENOUS | Status: AC
  Administered 2018-02-24: 1000 mL via INTRAVENOUS

## 2018-02-24 MED ORDER — ALBUTEROL SULFATE HFA 108 (90 BASE) MCG/ACT IN AERS
2.0000 | INHALATION_SPRAY | Freq: Once | RESPIRATORY_TRACT | Status: AC
  Administered 2018-02-24: 2 via RESPIRATORY_TRACT
  Filled 2018-02-24: qty 6.7

## 2018-02-24 MED ORDER — AZITHROMYCIN 500 MG IV SOLR
INTRAVENOUS | Status: AC
  Filled 2018-02-24: qty 500

## 2018-02-24 MED ORDER — IPRATROPIUM-ALBUTEROL 0.5-2.5 (3) MG/3ML IN SOLN
3.0000 mL | Freq: Once | RESPIRATORY_TRACT | Status: AC
  Administered 2018-02-24: 3 mL via RESPIRATORY_TRACT
  Filled 2018-02-24: qty 3

## 2018-02-24 MED ORDER — SODIUM CHLORIDE 0.9 % IV BOLUS (SEPSIS): 1000.0000 mL | Freq: Once | INTRAVENOUS | Status: DC

## 2018-02-24 MED ORDER — SODIUM CHLORIDE 0.9 % IV SOLN
2.0000 g | INTRAVENOUS | Status: DC
  Administered 2018-02-24: 2 g via INTRAVENOUS
  Filled 2018-02-24: qty 20

## 2018-02-24 MED ORDER — AZITHROMYCIN 250 MG PO TABS: 250.0000 mg | ORAL_TABLET | Freq: Every day | ORAL | 0 refills | Status: DC

## 2018-02-24 MED ORDER — SODIUM CHLORIDE 0.9 % IV BOLUS (SEPSIS): 250.0000 mL | Freq: Once | INTRAVENOUS | Status: DC

## 2018-02-24 MED ORDER — GUAIFENESIN-CODEINE 100-10 MG/5ML PO SOLN
10.0000 mL | Freq: Once | ORAL | Status: AC
  Administered 2018-02-24: 10 mL via ORAL
  Filled 2018-02-24: qty 10

## 2018-02-24 MED FILL — AZITHROMYCIN 250 MG TABLET: 250 | 4 days supply | Qty: 4 | Fill #0

## 2018-02-24 MED FILL — predniSONE 50 MG TABS: 50 | 5 days supply | Qty: 5 | Fill #0

## 2018-02-24 MED FILL — AMOX-CLAV 875-125 MG TABLET: 875-125 | 7 days supply | Qty: 14 | Fill #0

## 2018-02-24 NOTE — ED Provider Notes (Signed)
[facility] EMERGENCY DEPARTMENT Provider Note   CSN: [id number] Arrival date & time: 02/24/18  0954     History   Chief Complaint Chief Complaint  Patient presents with  . Shortness of Breath    HPI Ashley Wu is a 67 y.o. female.   Ashley Wu is a 67 y.o. Female history of bronchitis and arthritis, who presents to the emergency department for evaluation of 2 days of shortness of breath, cough and fever.  She reports she was recently treated for viral bronchitis and this improved with symptomatic treatment of cough but over the past 2 days her cough has returned and significantly worsened and she is now coughing up lots of clear mucus.  She reports she is felt very short of breath with coughing but also while sitting at rest.  She feels like she is been wheezing, and has been using her inhaler at home without improvement.  She reports fevers at home, and is noted to be febrile at 100.8 here in the emergency department.  She denies associated chest pain.  No rhinorrhea, sore throat or ear pain.  No abdominal pain, nausea or vomiting, no urinary symptoms.  She was treating symptoms with course sided once again but not improving, has not tried anything else to treat her symptoms.  Patient is not a smoker, she does not have diagnosed history of asthma or COPD.  No lower extremity pain or swelling, no recent surgeries or hospitalizations, no long distance travel, no history of PE or DVT.     Past Medical History:  Diagnosis Date  . Arthritis   . Bronchitis   . Osteoarthritis     There are no active problems to display for this patient.   Past Surgical History:  Procedure Laterality Date  . MOUTH SURGERY       OB History   None      Home Medications    Prior to Admission medications   Medication Sig Start Date End Date Taking? Authorizing Provider  albuterol (PROVENTIL HFA;VENTOLIN HFA) 108 (90 Base) MCG/ACT inhaler Inhale 2 puffs into  the lungs every 6 (six) hours as needed for wheezing or shortness of breath.    [provider]  amLODipine (NORVASC) 2.5 MG tablet Take 2.5 mg by mouth daily.    [provider]  amoxicillin-clavulanate (AUGMENTIN) 875-125 MG tablet Take 1 tablet by mouth 2 (two) times daily. One po bid x 7 days 02/24/18   Dartha Lodge, PA-C  azithromycin (ZITHROMAX) 250 MG tablet Take 1 tablet (250 mg total) by mouth daily. Take 1 tablet daily until finished starting tomorrow, you were given your first dose today 02/24/18   Dartha Lodge, PA-C  budesonide-formoterol [facility]) 160-4.5 MCG/ACT inhaler Inhale 2 puffs into the lungs 2 (two) times daily.    [provider]  fluticasone (FLONASE) 50 MCG/ACT nasal spray Place into both nostrils daily.    [provider]  HYDROcodone-acetaminophen (NORCO/VICODIN) 5-325 MG tablet Take 1 tablet by mouth daily. 05/15/16   Cammy Copa, MD  loratadine (CLARITIN) 10 MG tablet Take 1 tablet (10 mg total) by mouth daily. 08/07/16   Loren Racer, MD  methocarbamol (ROBAXIN) 750 MG tablet Take 1 tablet (750 mg total) by mouth 2 (two) times daily. 02/04/18   Cammy Copa, MD  oxaprozin (DAYPRO) 600 MG tablet Take 1 tablet (600 mg total) by mouth daily. 04/16/16   Cammy Copa, MD  predniSONE (DELTASONE) 50 MG tablet Take 1 tablet (50  mg total) by mouth daily for 5 days. 02/24/18 03/01/18  Dartha Lodge, PA-C  tiZANidine (ZANAFLEX) 4 MG tablet Take 1 tablet (4 mg total) by mouth every 8 (eight) hours as needed for muscle spasms. 02/05/18   Cammy Copa, MD    Family History Family History  Problem Relation Age of Onset  . Cancer Father     Social History Social History   Tobacco Use  . Smoking status: Never Smoker  . Smokeless tobacco: Never Used  Substance Use Topics  . Alcohol use: Yes    Comment: occ  . Drug use: No     Allergies   Advil [ibuprofen]; Aspirin; Baker's yeast [yeast]; and Orange fruit  [citrus]   Review of Systems Review of Systems  Constitutional: Positive for chills and fever.  HENT: Negative for congestion, rhinorrhea and sore throat.   Eyes: Negative for visual disturbance.  Respiratory: Positive for cough, shortness of breath and wheezing. Negative for chest tightness.   Cardiovascular: Negative for chest pain, palpitations and leg swelling.  Gastrointestinal: Negative for abdominal pain, nausea and vomiting.  Genitourinary: Negative for dysuria and frequency.  Musculoskeletal: Negative for arthralgias and myalgias.  Skin: Negative for color change and rash.  Neurological: Negative for dizziness, syncope and light-headedness.     Physical Exam Updated Vital Signs BP (!) 142/79 (BP Location: Left Arm)   Pulse (!) 118   Temp (!) 100.8 F (38.2 C) (Oral)   Resp (!) 22   Ht 5' 6.5" (1.689 m)   Wt 74.4 kg   SpO2 97%   BMI 26.07 kg/m    Physical Exam  Constitutional: She is oriented to person, place, and time. She appears well-developed and well-nourished. She appears ill. No distress.  Patient is mildly ill-appearing but in no acute distress, on room air  HENT:  Head: Normocephalic and atraumatic.  Mouth/Throat: Oropharynx is clear and moist.  TMs clear with good landmarks, mild nasal mucosa edema with clear rhinorrhea, posterior oropharynx clear and moist, with some erythema, no edema or exudates  Eyes: Pupils are equal, round, and reactive to light. EOM are normal. Right eye exhibits no discharge. Left eye exhibits no discharge.  Neck: Normal range of motion. Neck supple.  No ridigity  Cardiovascular: Normal rate, regular rhythm, normal heart sounds and intact distal pulses. Exam reveals no gallop and no friction rub.  No murmur heard. Pulses:      Radial pulses are 2+ on the right side, and 2+ on the left side.       Dorsalis pedis pulses are 2+ on the right side, and 2+ on the left side.       Posterior tibial pulses are 2+ on the right side, and  2+ on the left side.  Pulmonary/Chest: Effort normal. No respiratory distress. She exhibits no tenderness.  Patient mildly tachypneic with respiratory rate of 22, respirations equal and unlabored on room air, patient able to speak in full sentences, lungs with diffuse expiratory wheeze throughout with some rhonchi noted in the lower lung fields.  Abdominal: Soft. Bowel sounds are normal. She exhibits no distension and no mass. There is no tenderness. There is no guarding.  Abdomen soft, nondistended, nontender to palpation in all quadrants without guarding or peritoneal signs, no CVA tenderness  Musculoskeletal:       Right lower leg: Normal. She exhibits no tenderness and no edema.       Left lower leg: Normal. She exhibits no tenderness and no edema.  Neurological:  She is alert and oriented to person, place, and time. Coordination normal.  Alert and oriented, speech clear, following commands. No focal cranial nerve deficits noted.  Moving all extremities without difficulty.  Ambulatory with steady gait.  Skin: Skin is warm and dry. Capillary refill takes less than 2 seconds. She is not diaphoretic.  Psychiatric: She has a normal mood and affect. Her behavior is normal.  Nursing note and vitals reviewed.    ED Treatments / Results  Labs (all labs ordered are listed, but only abnormal results are displayed) Labs Reviewed  COMPREHENSIVE METABOLIC PANEL - Abnormal; Notable for the following components:      Result Value   BUN 6 (*)    Calcium 8.8 (*)    Total Protein 8.4 (*)    GFR calc non Af Amer 59 (*)    All other components within normal limits  CBC WITH DIFFERENTIAL/PLATELET - Abnormal; Notable for the following components:   RBC 5.40 (*)    Hemoglobin 15.3 (*)    HCT 46.2 (*)    All other components within normal limits  I-STAT CG4 LACTIC ACID, ED - Abnormal; Notable for the following components:   Lactic Acid, Venous 2.70 (*)    All other components within normal limits    CULTURE, BLOOD (ROUTINE X 2)  CULTURE, BLOOD (ROUTINE X 2)  I-STAT CG4 LACTIC ACID, ED  I-STAT CG4 LACTIC ACID, ED    EKG None  Radiology Dg Chest 2 View  Result Date: 02/24/2018 CLINICAL DATA:  Cough and congestion. EXAM: CHEST - 2 VIEW COMPARISON:  06/15/2017. FINDINGS: Mediastinum hilar structures normal. Borderline cardiomegaly. Mild right mid lung field infiltrate. Chronic peribronchial cuffing consistent chronic bronchitic change no pleural effusion or pneumothorax. Degenerative change thoracic spine. Thoracic spine scoliosis. IMPRESSION: Mild right mid lung field infiltrate.  Chronic bronchitic changes. Electronically Signed   By: Maisie Fus  Register   On: 02/24/2018 10:51    Procedures Procedures (including critical care time)  Medications Ordered in ED Medications  ipratropium-albuterol (DUONEB) 0.5-2.5 (3) MG/3ML nebulizer solution 3 mL (3 mLs Nebulization Given 02/24/18 1020)  albuterol (PROVENTIL) (2.5 MG/3ML) 0.083% nebulizer solution 2.5 mg (2.5 mg Nebulization Given 02/24/18 1020)  sodium chloride 0.9 % bolus 1,000 mL (0 mLs Intravenous Stopped 02/24/18 1257)  guaiFENesin-codeine 100-10 MG/5ML solution 10 mL (10 mLs Oral Given 02/24/18 1140)  acetaminophen (TYLENOL) tablet 1,000 mg (1,000 mg Oral Given 02/24/18 1140)  albuterol (PROVENTIL HFA;VENTOLIN HFA) 108 (90 Base) MCG/ACT inhaler 2 puff (2 puffs Inhalation Given 02/24/18 1444)     Initial Impression / Assessment and Plan / ED Course  I have reviewed the triage vital signs and the nursing notes.  Pertinent labs & imaging results that were available during my care of the patient were reviewed by me and considered in my medical decision making (see chart for details).  Presents for evaluation of fevers, shortness of breath and productive cough.  On arrival patient is febrile at 100.8 and tachycardic at 118, stable blood pressure and other vital signs are unremarkable.  Patient is exhibiting no respiratory distress, stable on  room air.  Lung fields with diffuse end expiratory wheezing with few rhonchi noted.  Presentation concerning for pneumonia patient does meet sepsis criteria at this time but does not appear clinically ill and this is unlikely severe sepsis.  Will get chest x-ray, basic labs, lactic acid and blood cultures.  Chest x-ray shows a right middle lobe infiltrate consistent with pneumonia as well as some chronic bronchitic changes.  Labs overall reassuring, there is no leukocytosis, hemoglobin is 15.3 I suspect this elevation is due to hemoconcentration, there is no acute electrolyte derangements, normal renal and liver function.  Initial lactic acid is elevated at 2.7. Blood cultures pending.  Will treat with 1 L fluid bolus, Rocephin and azithromycin, given patient's wheezing will also give breathing treatment, and 125 IV Solu-Medrol.  Guaifenesin-codeine for cough.  On reevaluation patient reports significant improvement in her symptoms.  Her lactic acid has significantly improved and is now 1.41.  Patient was ambulatory in the department and maintained pulse ox between 95-97% on room air, heart rate remained stable, with mild tachycardia which I expect in the setting of albuterol use.  Discussed with Dr. Dalene Seltzer who saw and evaluated the patient as well.  I feel given her significant clinical improvement and that she has remained stable on room air she will be a good candidate for outpatient antibiotic therapy.  Will treat with course of Augmentin and azithromycin.  We will also treat with steroid burst and continued bronchodilators for wheezing and bronchospasm.  She expresses understanding and is in agreement with this plan.  PCP follow-up in 2 to 3 days for recheck.  Strict return precautions discussed.  Final Clinical Impressions(s) / ED Diagnoses   Final diagnoses:  Community acquired pneumonia of right middle lobe of lung (HCC)  Bronchitis  SOB (shortness of breath)    ED Discharge Orders          Ordered    amoxicillin-clavulanate (AUGMENTIN) 875-125 MG tablet  2 times daily     02/24/18 1431    azithromycin (ZITHROMAX) 250 MG tablet  Daily     02/24/18 1431    predniSONE (DELTASONE) 50 MG tablet  Daily     02/24/18 1431          Jodi Geralds [place], [place] 02/24/18 2058  Alvira Monday, MD 02/25/18 1502

## 2018-02-24 NOTE — ED Triage Notes (Signed)
Shortness of breath x 2 days, fever x 2 days. Hx bronchitis recently, coughing clear mucus per pt. alerrt and oriented x 4, able to speak full sentences.

## 2018-02-24 NOTE — Discharge Instructions (Addendum)
Your chest x-ray shows a pneumonia, I am reassured that your symptoms have improved somewhat here in the emergency department, please take both antibiotics as directed and use inhaler every 4-6 hours for the next 24 hours and then as needed.  Take daily prednisone starting tomorrow.  Please follow-up with your primary care doctor in the next 2 to 3 days for recheck to ensure your symptoms are improving.  If you develop high fevers, worsening shortness of breath, chest pain, cough feel lightheaded or dizzy or any other new or concerning symptoms please return to the emergency department for reevaluation.

## 2018-02-25 ENCOUNTER — Telehealth (HOSPITAL_BASED_OUTPATIENT_CLINIC_OR_DEPARTMENT_OTHER): Payer: Self-pay | Admitting: *Deleted

## 2018-02-25 LAB — BLOOD CULTURE ID PANEL (REFLEXED)
Acinetobacter baumannii: NOT DETECTED
CANDIDA KRUSEI: NOT DETECTED
CANDIDA PARAPSILOSIS: NOT DETECTED
Candida albicans: NOT DETECTED
Candida glabrata: NOT DETECTED
Candida tropicalis: NOT DETECTED
ENTEROCOCCUS SPECIES: NOT DETECTED
ESCHERICHIA COLI: NOT DETECTED
Enterobacter cloacae complex: NOT DETECTED
Enterobacteriaceae species: NOT DETECTED
Haemophilus influenzae: NOT DETECTED
Klebsiella oxytoca: NOT DETECTED
Klebsiella pneumoniae: NOT DETECTED
LISTERIA MONOCYTOGENES: NOT DETECTED
METHICILLIN RESISTANCE: NOT DETECTED
Neisseria meningitidis: NOT DETECTED
Proteus species: NOT DETECTED
Pseudomonas aeruginosa: NOT DETECTED
STAPHYLOCOCCUS AUREUS BCID: NOT DETECTED
STAPHYLOCOCCUS SPECIES: DETECTED — AB
Serratia marcescens: NOT DETECTED
Streptococcus agalactiae: NOT DETECTED
Streptococcus pneumoniae: NOT DETECTED
Streptococcus pyogenes: NOT DETECTED
Streptococcus species: NOT DETECTED

## 2018-02-27 LAB — CULTURE, BLOOD (ROUTINE X 2): SPECIAL REQUESTS: ADEQUATE

## 2018-02-28 ENCOUNTER — Telehealth: Payer: Self-pay

## 2018-02-28 NOTE — Telephone Encounter (Signed)
Post ED Visit - Positive Culture Follow-up  Culture report reviewed by antimicrobial stewardship pharmacist:  []  Enzo Bi, Pharm.D. []  Celedonio Miyamoto, Pharm.D., BCPS AQ-ID []  Garvin Fila, Pharm.D., BCPS []  Georgina Pillion, Pharm.D., BCPS []  [place], [address].D., BCPS, AAHIVP []  Estella Husk, Pharm.D., BCPS, AAHIVP []  Lysle Pearl, PharmD, BCPS []  Phillips Climes, PharmD, BCPS [x]  Agapito Games, PharmD, BCPS []  Verlan Friends, PharmD  Positive Blood culture Treated with Amoxicillin and azithromycin, organism sensitive to the same and no further patient follow-up is required at this time.  Jerry Caras 02/28/2018, 9:45 AM

## 2018-03-01 LAB — CULTURE, BLOOD (ROUTINE X 2)
Culture: NO GROWTH
Special Requests: ADEQUATE

## 2018-05-22 ENCOUNTER — Emergency Department (HOSPITAL_COMMUNITY)
Admission: EM | Admit: 2018-05-22 | Discharge: 2018-05-22 | Disposition: A | Discharge status: Home / Self Care | Payer: Medicare HMO | Attending: Emergency Medicine | Admitting: Emergency Medicine

## 2018-05-22 ENCOUNTER — Encounter (HOSPITAL_COMMUNITY): Payer: Self-pay | Admitting: Emergency Medicine

## 2018-05-22 ENCOUNTER — Emergency Department (HOSPITAL_COMMUNITY): Payer: Medicare HMO

## 2018-05-22 ENCOUNTER — Other Ambulatory Visit: Payer: Self-pay

## 2018-05-22 DIAGNOSIS — J441 Chronic obstructive pulmonary disease with (acute) exacerbation: Secondary | ICD-10-CM | POA: Diagnosis not present

## 2018-05-22 DIAGNOSIS — Z79899 Other long term (current) drug therapy: Secondary | ICD-10-CM | POA: Insufficient documentation

## 2018-05-22 DIAGNOSIS — R0602 Shortness of breath: Secondary | ICD-10-CM | POA: Diagnosis present

## 2018-05-22 LAB — CBC WITH DIFFERENTIAL/PLATELET
Abs Immature Granulocytes: 0.02 10*3/uL (ref 0.00–0.07)
BASOS ABS: 0.1 10*3/uL (ref 0.0–0.1)
BASOS PCT: 1 %
EOS ABS: 0.2 10*3/uL (ref 0.0–0.5)
Eosinophils Relative: 3 %
HCT: 51 % — ABNORMAL HIGH (ref 36.0–46.0)
Hemoglobin: 15.5 g/dL — ABNORMAL HIGH (ref 12.0–15.0)
Immature Granulocytes: 0 %
Lymphocytes Relative: 16 %
Lymphs Abs: 1.3 10*3/uL (ref 0.7–4.0)
MCH: 27.2 pg (ref 26.0–34.0)
MCHC: 30.4 g/dL (ref 30.0–36.0)
MCV: 89.6 fL (ref 80.0–100.0)
Monocytes Absolute: 0.2 10*3/uL (ref 0.1–1.0)
Monocytes Relative: 3 %
NRBC: 0 % (ref 0.0–0.2)
Neutro Abs: 6.3 10*3/uL (ref 1.7–7.7)
Neutrophils Relative %: 77 %
PLATELETS: 257 10*3/uL (ref 150–400)
RBC: 5.69 MIL/uL — AB (ref 3.87–5.11)
RDW: 14.6 % (ref 11.5–15.5)
WBC: 8.2 10*3/uL (ref 4.0–10.5)

## 2018-05-22 LAB — I-STAT CHEM 8, ED
BUN: 5 mg/dL — ABNORMAL LOW (ref 8–23)
CALCIUM ION: 1.12 mmol/L — AB (ref 1.15–1.40)
Chloride: 102 mmol/L (ref 98–111)
Creatinine, Ser: 0.8 mg/dL (ref 0.44–1.00)
Glucose, Bld: 153 mg/dL — ABNORMAL HIGH (ref 70–99)
HEMATOCRIT: 51 % — AB (ref 36.0–46.0)
HEMOGLOBIN: 17.3 g/dL — AB (ref 12.0–15.0)
Potassium: 3.4 mmol/L — ABNORMAL LOW (ref 3.5–5.1)
SODIUM: 143 mmol/L (ref 135–145)
TCO2: 30 mmol/L (ref 22–32)

## 2018-05-22 MED ORDER — ACETAMINOPHEN 325 MG PO TABS
650.0000 mg | ORAL_TABLET | Freq: Once | ORAL | Status: AC
  Administered 2018-05-22: 650 mg via ORAL
  Filled 2018-05-22: qty 2

## 2018-05-22 MED ORDER — GUAIFENESIN-DM 100-10 MG/5ML PO SYRP: 5.0000 mL | ORAL_SOLUTION | Freq: Three times a day (TID) | ORAL | 0 refills | Status: DC | PRN

## 2018-05-22 MED ORDER — PREDNISONE 20 MG PO TABS: 40.0000 mg | ORAL_TABLET | Freq: Every day | ORAL | 0 refills | Status: DC

## 2018-05-22 MED ORDER — AZITHROMYCIN 250 MG PO TABS: 250.0000 mg | ORAL_TABLET | Freq: Every day | ORAL | 0 refills | Status: DC

## 2018-05-22 NOTE — ED Notes (Signed)
Patient transported to X-ray 

## 2018-05-22 NOTE — ED Notes (Signed)
Discharge instructions and prescription discussed with Pt. Pt verbalized understanding. Pt stable and ambulatory.    

## 2018-05-22 NOTE — ED Provider Notes (Signed)
[facility] EMERGENCY DEPARTMENT Provider Note   CSN: [id number] Arrival date & time: 05/22/18  1127     History   Chief Complaint Chief Complaint  Patient presents with  . Shortness of Breath    HPI Ashley Wu is a 67 y.o. female.  HPI Patient presents with shortness of breath.  Began last night.  History of chronic bronchitis.  Inhalers at home with not help.  Has had a cough with production of clear mucus.  Not on oxygen at home.  Has not had to be admitted to the hospital for this.  States she still has her inhaler.  Was given breathing treatments and Solu-Medrol by EMS.  Still difficulty breathing but states she is feeling somewhat better.  Recently had been treated for pneumonia. Past Medical History:  Diagnosis Date  . Arthritis   . Bronchitis   . Osteoarthritis     There are no active problems to display for this patient.   Past Surgical History:  Procedure Laterality Date  . MOUTH SURGERY       OB History   None      Home Medications    Prior to Admission medications   Medication Sig Start Date End Date Taking? Authorizing Provider  albuterol (PROVENTIL HFA;VENTOLIN HFA) 108 (90 Base) MCG/ACT inhaler Inhale 2 puffs into the lungs every 6 (six) hours as needed for wheezing or shortness of breath.    [provider]  amLODipine (NORVASC) 2.5 MG tablet Take 2.5 mg by mouth daily.    [provider]  amoxicillin-clavulanate (AUGMENTIN) 875-125 MG tablet Take 1 tablet by mouth 2 (two) times daily. One po bid x 7 days 02/24/18   Dartha Lodge, PA-C  azithromycin (ZITHROMAX) 250 MG tablet Take 1 tablet (250 mg total) by mouth daily. Take first 2 tablets together, then 1 every day until finished. 05/22/18   Benjiman Core, MD  budesonide-formoterol [facility]) 160-4.5 MCG/ACT inhaler Inhale 2 puffs into the lungs 2 (two) times daily.    [provider]  fluticasone (FLONASE) 50 MCG/ACT nasal spray Place  into both nostrils daily.    [provider]  guaiFENesin-dextromethorphan (ROBITUSSIN DM) 100-10 MG/5ML syrup Take 5 mLs by mouth 3 (three) times daily as needed for cough. 05/22/18   Benjiman Core, MD  HYDROcodone-acetaminophen (NORCO/VICODIN) 5-325 MG tablet Take 1 tablet by mouth daily. 05/15/16   Cammy Copa, MD  loratadine (CLARITIN) 10 MG tablet Take 1 tablet (10 mg total) by mouth daily. 08/07/16   Loren Racer, MD  methocarbamol (ROBAXIN) 750 MG tablet Take 1 tablet (750 mg total) by mouth 2 (two) times daily. 02/04/18   Cammy Copa, MD  oxaprozin (DAYPRO) 600 MG tablet Take 1 tablet (600 mg total) by mouth daily. 04/16/16   Cammy Copa, MD  predniSONE (DELTASONE) 20 MG tablet Take 2 tablets (40 mg total) by mouth daily. 05/22/18   Benjiman Core, MD  tiZANidine (ZANAFLEX) 4 MG tablet Take 1 tablet (4 mg total) by mouth every 8 (eight) hours as needed for muscle spasms. 02/05/18   Cammy Copa, MD    Family History Family History  Problem Relation Age of Onset  . Cancer Father     Social History Social History   Tobacco Use  . Smoking status: Never Smoker  . Smokeless tobacco: Never Used  Substance Use Topics  . Alcohol use: Yes    Comment: occ  . Drug use: No     Allergies  Advil [ibuprofen]; Aspirin; Baker's yeast [yeast]; and Orange fruit [citrus]   Review of Systems Review of Systems  Constitutional: Negative for appetite change.  HENT: Negative for congestion.   Respiratory: Positive for cough, chest tightness, shortness of breath and wheezing.   Cardiovascular: Negative for chest pain and leg swelling.  Gastrointestinal: Negative for abdominal pain.  Genitourinary: Negative for dysuria.  Musculoskeletal: Negative for back pain.  Skin: Negative for rash.  Neurological: Negative for weakness.  Psychiatric/Behavioral: Negative for confusion.     Physical Exam Updated Vital Signs BP (!) 145/94   Pulse (!)  111   Temp 98.1 F (36.7 C) (Oral)   Resp 19   Ht 5\' 6"  (1.676 m)   Wt 75.3 kg   SpO2 97%   BMI 26.79 kg/m   Physical Exam  Constitutional: She appears well-developed.  HENT:  Head: Normocephalic.  Eyes: EOM are normal.  Neck: Normal range of motion.  Cardiovascular:  Mild tachycardia.  Pulmonary/Chest:  Diffuse wheezes.  Also inspiratory lung sounds.  Abdominal: Soft.  Musculoskeletal:       Right lower leg: She exhibits no tenderness.       Left lower leg: She exhibits no tenderness.  Neurological: She is alert.  Skin: Skin is warm. Capillary refill takes less than 2 seconds.     ED Treatments / Results  Labs (all labs ordered are listed, but only abnormal results are displayed) Labs Reviewed  CBC WITH DIFFERENTIAL/PLATELET - Abnormal; Notable for the following components:      Result Value   RBC 5.69 (*)    Hemoglobin 15.5 (*)    HCT 51.0 (*)    All other components within normal limits  I-STAT CHEM 8, ED - Abnormal; Notable for the following components:   Potassium 3.4 (*)    BUN 5 (*)    Glucose, Bld 153 (*)    Calcium, Ion 1.12 (*)    Hemoglobin 17.3 (*)    HCT 51.0 (*)    All other components within normal limits    EKG EKG Interpretation  Date/Time:  Friday May 22 2018 11:40:40 EST Ventricular Rate:  109 PR Interval:    QRS Duration: 86 QT Interval:  287 QTC Calculation: 387 R Axis:   50 Text Interpretation:  Sinus tachycardia Atrial premature complexes Consider right atrial enlargement Borderline T wave abnormalities ST elevation, consider inferior injury Confirmed by Benjiman Core [phone]) on 05/22/2018 12:16:00 PM   Radiology Dg Chest 2 View  Result Date: 05/22/2018 CLINICAL DATA:  Shortness of breath. EXAM: CHEST - 2 VIEW COMPARISON:  02/24/2018. FINDINGS: Mediastinum and hilar structures normal. Lungs are clear. No pleural effusion or pneumothorax. Heart size normal. Thoracic spine degenerative changes scoliosis. IMPRESSION: No  acute cardiopulmonary disease. Electronically Signed   By: Maisie Fus  Register   On: 05/22/2018 12:10    Procedures Procedures (including critical care time)  Medications Ordered in ED Medications  acetaminophen (TYLENOL) tablet 650 mg (650 mg Oral Given 05/22/18 1349)     Initial Impression / Assessment and Plan / ED Course  I have reviewed the triage vital signs and the nursing notes.  Pertinent labs & imaging results that were available during my care of the patient were reviewed by me and considered in my medical decision making (see chart for details).     Patient presents with shortness of breath.  History of COPD/chronic bronchitis.  Has had cough with some sputum production.  Diffuse wheezes but feels better after breathing treatments.  Has had  steroids by EMS.  X-ray does not show pneumonia.  Will treat with antibiotics due to the COPD.  Give steroids for home.  Well-appearing.  Does have some continued tachycardia but lungs improved and not hypoxic.  Discharge home.  Final Clinical Impressions(s) / ED Diagnoses   Final diagnoses:  COPD exacerbation [facility](HCC)    ED Discharge Orders         Ordered    predniSONE (DELTASONE) 20 MG tablet  Daily     05/22/18 1412    azithromycin (ZITHROMAX) 250 MG tablet  Daily     05/22/18 1412    guaiFENesin-dextromethorphan (ROBITUSSIN DM) 100-10 MG/5ML syrup  3 times daily PRN     05/22/18 1412           Benjiman CorePickering, Aanshi Batchelder, MD 05/22/18 1422

## 2018-05-22 NOTE — ED Triage Notes (Signed)
Pt here via [facility] c/o SOB x 1 day that started last night, pt has done home nebs with no relief, productive cough with clear mucous, denies any fevers, hx f asthma, bronchitis and recent pneumonia. Wheezing noted to all fields, Pt given 2 duonebs and solumedrol  by EMS

## 2019-07-02 ENCOUNTER — Encounter (HOSPITAL_BASED_OUTPATIENT_CLINIC_OR_DEPARTMENT_OTHER): Payer: Self-pay | Admitting: *Deleted

## 2019-07-02 ENCOUNTER — Emergency Department (HOSPITAL_BASED_OUTPATIENT_CLINIC_OR_DEPARTMENT_OTHER)
Admission: EM | Admit: 2019-07-02 | Discharge: 2019-07-02 | Disposition: A | Discharge status: Home / Self Care | Payer: Medicare HMO | Attending: Emergency Medicine | Admitting: Emergency Medicine

## 2019-07-02 ENCOUNTER — Emergency Department (HOSPITAL_BASED_OUTPATIENT_CLINIC_OR_DEPARTMENT_OTHER): Payer: Medicare HMO

## 2019-07-02 ENCOUNTER — Other Ambulatory Visit: Payer: Self-pay

## 2019-07-02 DIAGNOSIS — R062 Wheezing: Secondary | ICD-10-CM

## 2019-07-02 DIAGNOSIS — Z20822 Contact with and (suspected) exposure to covid-19: Secondary | ICD-10-CM | POA: Insufficient documentation

## 2019-07-02 DIAGNOSIS — J40 Bronchitis, not specified as acute or chronic: Secondary | ICD-10-CM | POA: Insufficient documentation

## 2019-07-02 DIAGNOSIS — R0789 Other chest pain: Secondary | ICD-10-CM | POA: Diagnosis not present

## 2019-07-02 DIAGNOSIS — R0602 Shortness of breath: Secondary | ICD-10-CM | POA: Insufficient documentation

## 2019-07-02 DIAGNOSIS — J441 Chronic obstructive pulmonary disease with (acute) exacerbation: Secondary | ICD-10-CM | POA: Insufficient documentation

## 2019-07-02 DIAGNOSIS — J4521 Mild intermittent asthma with (acute) exacerbation: Secondary | ICD-10-CM

## 2019-07-02 LAB — CBC WITH DIFFERENTIAL/PLATELET
Abs Immature Granulocytes: 0.01 10*3/uL (ref 0.00–0.07)
Basophils Absolute: 0.1 10*3/uL (ref 0.0–0.1)
Basophils Relative: 1 %
Eosinophils Absolute: 0.3 10*3/uL (ref 0.0–0.5)
Eosinophils Relative: 4 %
HCT: 50.9 % — ABNORMAL HIGH (ref 36.0–46.0)
Hemoglobin: 16.6 g/dL — ABNORMAL HIGH (ref 12.0–15.0)
Immature Granulocytes: 0 %
Lymphocytes Relative: 29 %
Lymphs Abs: 2.2 10*3/uL (ref 0.7–4.0)
MCH: 28.1 pg (ref 26.0–34.0)
MCHC: 32.6 g/dL (ref 30.0–36.0)
MCV: 86.3 fL (ref 80.0–100.0)
Monocytes Absolute: 0.6 10*3/uL (ref 0.1–1.0)
Monocytes Relative: 8 %
Neutro Abs: 4.3 10*3/uL (ref 1.7–7.7)
Neutrophils Relative %: 58 %
Platelets: 254 10*3/uL (ref 150–400)
RBC: 5.9 MIL/uL — ABNORMAL HIGH (ref 3.87–5.11)
RDW: 15.5 % (ref 11.5–15.5)
WBC: 7.4 10*3/uL (ref 4.0–10.5)
nRBC: 0 % (ref 0.0–0.2)

## 2019-07-02 LAB — SARS CORONAVIRUS 2 AG (30 MIN TAT): SARS Coronavirus 2 Ag: NEGATIVE

## 2019-07-02 LAB — BASIC METABOLIC PANEL
Anion gap: 13 (ref 5–15)
BUN: 9 mg/dL (ref 8–23)
CO2: 28 mmol/L (ref 22–32)
Calcium: 9.7 mg/dL (ref 8.9–10.3)
Chloride: 101 mmol/L (ref 98–111)
Creatinine, Ser: 0.84 mg/dL (ref 0.44–1.00)
GFR calc Af Amer: >60 mL/min (ref >60)
GFR calc non Af Amer: >60 mL/min (ref >60)
Glucose, Bld: 101 mg/dL — ABNORMAL HIGH (ref 70–99)
Potassium: 4 mmol/L (ref 3.5–5.1)
Sodium: 142 mmol/L (ref 135–145)

## 2019-07-02 MED ORDER — PREDNISONE 10 MG PO TABS: 40.0000 mg | ORAL_TABLET | Freq: Every day | ORAL | 0 refills | Status: AC

## 2019-07-02 MED ORDER — SODIUM CHLORIDE 0.9 % IV BOLUS
1000.0000 mL | Freq: Once | INTRAVENOUS | Status: AC
  Administered 2019-07-02: 1000 mL via INTRAVENOUS

## 2019-07-02 MED ORDER — ALBUTEROL SULFATE HFA 108 (90 BASE) MCG/ACT IN AERS
8.0000 | INHALATION_SPRAY | Freq: Once | RESPIRATORY_TRACT | Status: AC
  Administered 2019-07-02: 8 via RESPIRATORY_TRACT
  Filled 2019-07-02: qty 6.7

## 2019-07-02 MED ORDER — METHYLPREDNISOLONE SODIUM SUCC 125 MG IJ SOLR
125.0000 mg | Freq: Once | INTRAMUSCULAR | Status: AC
  Administered 2019-07-02: 125 mg via INTRAVENOUS
  Filled 2019-07-02: qty 2

## 2019-07-02 MED ORDER — IPRATROPIUM BROMIDE HFA 17 MCG/ACT IN AERS
2.0000 | INHALATION_SPRAY | Freq: Once | RESPIRATORY_TRACT | Status: AC
  Administered 2019-07-02: 2 via RESPIRATORY_TRACT
  Filled 2019-07-02: qty 12.9

## 2019-07-02 MED ORDER — ALBUTEROL SULFATE (5 MG/ML) 0.5% IN NEBU: 2.5000 mg | INHALATION_SOLUTION | Freq: Four times a day (QID) | RESPIRATORY_TRACT | 3 refills | Status: AC | PRN

## 2019-07-02 MED FILL — ALBUTEROL 2.5 MG/0.5 ML SOL: 2.5 | 7 days supply | Qty: 30 | Fill #0

## 2019-07-02 MED FILL — predniSONE 10 MG TABS: 10 | 4 days supply | Qty: 16 | Fill #0

## 2019-07-02 NOTE — Discharge Instructions (Signed)
Start taking prednisone beginning tomorrow as you received the first dose in the emergency department today.  Continue to use your albuterol inhaler 1 to 2 puffs every 4-6 hours as needed for shortness of breath. You can use nebulizers at a similar frequency.  If you find that you are using your nebulizers more frequently than this please seek evaluation at an urgent care, emergency department or by her PCP immediately. Drink plenty of fluids and get plenty of rest.  Return to the emergency department if any concerning signs or symptoms develop such as worsening shortness of breath, high fevers, persistent vomiting, chest pains or loss of consciousness.

## 2019-07-02 NOTE — ED Notes (Signed)
ED Provider at bedside. 

## 2019-07-02 NOTE — ED Provider Notes (Signed)
[facility] EMERGENCY DEPARTMENT Provider Note   CSN: [id number] Arrival date & time: 07/02/19  1505     History Chief Complaint  Patient presents with   Wheezing    Ashley Wu is a 69 y.o. female with history of arthritis, bronchitis, osteoarthritis presents today for evaluation of acute onset, persistent wheezing beginning this morning.  Reports that she woke out of her sleep feeling wheezy and a little short of breath.  She took her Symbicort with improvement was able to fall back asleep but then she awoke again around 10 AM with worsening symptoms.  She has had a few puffs of albuterol and a DuoNeb with temporary improvement.  She reports cough productive of clear sputum.  Denies any chest pain, fever, abdominal pain, nausea, or vomiting.  No known sick contacts.  She is a non-smoker.  Reports this feels similar to prior flareups of "bronchitis".  She denies recent travel or surgeries, no hemoptysis, no prior history of DVT or PE and she is not on any hormone replacement therapy.  The history is provided by the patient.       Past Medical History:  Diagnosis Date   Arthritis    Bronchitis    Osteoarthritis     There are no problems to display for this patient.   Past Surgical History:  Procedure Laterality Date   MOUTH SURGERY       OB History   No obstetric history on file.     Family History  Problem Relation Age of Onset   Cancer Father     Social History   Tobacco Use   Smoking status: Never Smoker   Smokeless tobacco: Never Used  Substance Use Topics   Alcohol use: Yes    Comment: occ   Drug use: No    Home Medications Prior to Admission medications   Medication Sig Start Date End Date Taking? Authorizing Provider  albuterol (PROVENTIL HFA;VENTOLIN HFA) 108 (90 Base) MCG/ACT inhaler Inhale 2 puffs into the lungs every 6 (six) hours as needed for wheezing or shortness of breath.    [provider]  albuterol  (PROVENTIL) (5 MG/ML) 0.5% nebulizer solution Take 0.5 mLs (2.5 mg total) by nebulization every 6 (six) hours as needed for wheezing or shortness of breath. 07/02/19   Henryetta Corriveau A, PA-C  amLODipine (NORVASC) 2.5 MG tablet Take 2.5 mg by mouth daily.    [provider]  amoxicillin-clavulanate (AUGMENTIN) 875-125 MG tablet Take 1 tablet by mouth 2 (two) times daily. One po bid x 7 days 02/24/18   Jacqlyn Larsen, PA-C  azithromycin (ZITHROMAX) 250 MG tablet Take 1 tablet (250 mg total) by mouth daily. Take first 2 tablets together, then 1 every day until finished. 05/22/18   Davonna Belling, MD  budesonide-formoterol [facility]) 160-4.5 MCG/ACT inhaler Inhale 2 puffs into the lungs 2 (two) times daily.    [provider]  fluticasone (FLONASE) 50 MCG/ACT nasal spray Place into both nostrils daily.    [provider]  guaiFENesin-dextromethorphan (ROBITUSSIN DM) 100-10 MG/5ML syrup Take 5 mLs by mouth 3 (three) times daily as needed for cough. 05/22/18   Davonna Belling, MD  HYDROcodone-acetaminophen (NORCO/VICODIN) 5-325 MG tablet Take 1 tablet by mouth daily. 05/15/16   Meredith Pel, MD  loratadine (CLARITIN) 10 MG tablet Take 1 tablet (10 mg total) by mouth daily. 08/07/16   Julianne Rice, MD  methocarbamol (ROBAXIN) 750 MG tablet Take 1 tablet (750 mg total) by mouth 2 (two) times  daily. 02/04/18   Cammy Copa, MD  oxaprozin (DAYPRO) 600 MG tablet Take 1 tablet (600 mg total) by mouth daily. 04/16/16   Cammy Copa, MD  predniSONE (DELTASONE) 10 MG tablet Take 4 tablets (40 mg total) by mouth daily with breakfast for 4 days. 07/02/19 07/06/19  Michela Pitcher A, PA-C  tiZANidine (ZANAFLEX) 4 MG tablet Take 1 tablet (4 mg total) by mouth every 8 (eight) hours as needed for muscle spasms. 02/05/18   Cammy Copa, MD    Allergies    Advil [ibuprofen], Aspirin, Baker's yeast [yeast], and Orange fruit [citrus]  Review of Systems   Review of Systems    Constitutional: Negative for chills and fever.  Respiratory: Positive for cough, chest tightness and shortness of breath.   Cardiovascular: Negative for chest pain and leg swelling.  Gastrointestinal: Negative for abdominal pain, nausea and vomiting.  All other systems reviewed and are negative.   Physical Exam Updated Vital Signs BP (!) 152/91    Pulse (!) 117    Temp 98.7 F (37.1 C) (Oral)    Resp (!) 24    Ht 5' 6.5" (1.689 m)    Wt 75.3 kg    SpO2 98%    BMI 26.39 kg/m   Physical Exam Vitals and nursing note reviewed.  Constitutional:      General: She is not in acute distress.    Appearance: She is well-developed.  HENT:     Head: Normocephalic and atraumatic.  Eyes:     General:        Right eye: No discharge.        Left eye: No discharge.     Conjunctiva/sclera: Conjunctivae normal.  Neck:     Vascular: No JVD.     Trachea: No tracheal deviation.  Cardiovascular:     Rate and Rhythm: Regular rhythm. Tachycardia present.     Pulses: Normal pulses.     Heart sounds: Normal heart sounds.  Pulmonary:     Breath sounds: Wheezing present.     Comments: Speaking in full sentences without difficulty.  Mildly tachypneic.  Diffuse expiratory wheezes. Abdominal:     General: Bowel sounds are normal. There is no distension.     Palpations: Abdomen is soft.     Tenderness: There is no abdominal tenderness. There is no guarding or rebound.  Skin:    General: Skin is warm and dry.     Findings: No erythema.  Neurological:     Mental Status: She is alert.  Psychiatric:        Behavior: Behavior normal.     ED Results / Procedures / Treatments   Labs (all labs ordered are listed, but only abnormal results are displayed) Labs Reviewed  BASIC METABOLIC PANEL - Abnormal; Notable for the following components:      Result Value   Glucose, Bld 101 (*)    All other components within normal limits  CBC WITH DIFFERENTIAL/PLATELET - Abnormal; Notable for the following  components:   RBC 5.90 (*)    Hemoglobin 16.6 (*)    HCT 50.9 (*)    All other components within normal limits  SARS CORONAVIRUS 2 AG (30 MIN TAT)    EKG EKG Interpretation  Date/Time:  Friday July 02 2019 15:18:21 EST Ventricular Rate:  115 PR Interval:  182 QRS Duration: 82 QT Interval:  330 QTC Calculation: 456 R Axis:   57 Text Interpretation: Sinus tachycardia Anterior infarct , age undetermined Abnormal ECG No  significant change since last tracing Confirmed by Frederick Peers [phone]) on 07/02/2019 4:07:43 PM   Radiology DG Chest Portable 1 View  Result Date: 07/02/2019 CLINICAL DATA:  Cough and wheezing EXAM: PORTABLE CHEST 1 VIEW COMPARISON:  05/22/2018 FINDINGS: No acute consolidation or pleural effusion. Mild chronic bronchitic changes. Normal heart size. No pneumothorax. IMPRESSION: Mild chronic bronchitic changes. No acute infiltrate or pleural effusion. Electronically Signed   By: Jasmine Pang M.D.   On: 07/02/2019 16:32    Procedures Procedures (including critical care time)  Medications Ordered in ED Medications  albuterol (VENTOLIN HFA) 108 (90 Base) MCG/ACT inhaler 8 puff (8 puffs Inhalation Given 07/02/19 1526)  ipratropium (ATROVENT HFA) inhaler 2 puff (2 puffs Inhalation Given 07/02/19 1547)  methylPREDNISolone sodium succinate (SOLU-MEDROL) 125 mg/2 mL injection 125 mg (125 mg Intravenous Given 07/02/19 1605)  sodium chloride 0.9 % bolus 1,000 mL (1,000 mLs Intravenous New Bag/Given 07/02/19 1604)    ED Course  I have reviewed the triage vital signs and the nursing notes.  Pertinent labs & imaging results that were available during my care of the patient were reviewed by me and considered in my medical decision making (see chart for details).    MDM Rules/Calculators/A&P                      Celita Wu was evaluated in Emergency Department on 07/02/2019 for the symptoms described in the history of present illness. She was evaluated in the context  of the global COVID-19 pandemic, which necessitated consideration that the patient might be at risk for infection with the SARS-CoV-2 virus that causes COVID-19. Institutional protocols and algorithms that pertain to the evaluation of patients at risk for COVID-19 are in a state of rapid change based on information released by regulatory bodies including the CDC and federal and state organizations. These policies and algorithms were followed during the patient's care in the ED.  Patient presenting for evaluation of wheezing and shortness of breath beginning this morning.  Otherwise has felt well.  She is afebrile, initially tachypneic and tachycardic however has been using albuterol with nebulizer treatments prior to arrival and had just received 4 puffs of albuterol prior to my assessment that this is not unexpected.  She is overall well-appearing speaking in full sentences without difficulty and SPO2 saturations are stable with ambulation.  Diffuse expiratory wheezes noted on auscultation of the lungs.  Chest x-ray shows some mild bronchitic changes no edema or consolidation.  EKG shows sinus tachycardia, no acute ischemic abnormalities.  Doubt PE.  Reports symptoms are consistent with her usual bronchitis flares.  No chest pain, doubt ACS/MI, cardiac tamponade, esophageal rupture.  Chest x-ray shows no evidence of pneumonia or pneumothorax.  No evidence of CHF.  Lab work reviewed by me shows elevated hemoglobin and hematocrit, could be secondary to dehydration.  No leukocytosis, no renal insufficiency, no metabolic derangements.  She was given IV fluids, Solu-Medrol and ipratropium and albuterol via inhaler in the ED.  Unable to provide nebulizer treatment due to COVID-19 pandemic.  Her point-of-care Covid test was negative and she does not really have any infectious symptoms.  On reevaluation she is resting comfortably in no apparent distress.  Heart rate is now under 100 bpm, respiratory rate has improved and  SPO2 saturations are 99 to 100% on room air.  She is speaking full sentences and reports that she is feeling much better.  Wheezing has improved.  Will discharge home with prednisone burst.  Discussed strict ED return precautions and recommend follow-up with PCP for reevaluation of symptoms. Patient verbalized understanding of and agreement with plan and is safe for discharge home at this time.  No complaints prior to discharge.   Final Clinical Impression(s) / ED Diagnoses Final diagnoses:  Bronchitis  Mild intermittent asthma with acute exacerbation  Wheezing    Rx / DC Orders ED Discharge Orders         Ordered    albuterol (PROVENTIL) (5 MG/ML) 0.5% nebulizer solution  Every 6 hours PRN     07/02/19 1658    predniSONE (DELTASONE) 10 MG tablet  Daily with breakfast     07/02/19 1658           Dilia Alemany, [place] A, PA-C 07/02/19 1714    Little, Ambrose Finland, MD 07/03/19 1601

## 2019-07-02 NOTE — ED Triage Notes (Signed)
Woke with wheezing. She used her inhaler and a neb tx with short term relief. Wheezing on arrival to ER.

## 2019-07-29 MED FILL — ALBUTEROL 2.5 MG/0.5 ML SOL: 2.5 | 7 days supply | Qty: 30 | Fill #0

## 2020-08-11 DIAGNOSIS — J302 Other seasonal allergic rhinitis: Secondary | ICD-10-CM | POA: Diagnosis not present

## 2020-08-11 DIAGNOSIS — I10 Essential (primary) hypertension: Secondary | ICD-10-CM | POA: Diagnosis not present

## 2020-08-11 DIAGNOSIS — Z23 Encounter for immunization: Secondary | ICD-10-CM | POA: Diagnosis not present

## 2020-08-11 DIAGNOSIS — J4550 Severe persistent asthma, uncomplicated: Secondary | ICD-10-CM | POA: Diagnosis not present

## 2020-10-19 ENCOUNTER — Other Ambulatory Visit: Payer: Self-pay

## 2020-10-19 ENCOUNTER — Ambulatory Visit (INDEPENDENT_AMBULATORY_CARE_PROVIDER_SITE_OTHER): Payer: Medicare HMO | Admitting: Family Medicine

## 2020-10-19 DIAGNOSIS — M5441 Lumbago with sciatica, right side: Secondary | ICD-10-CM

## 2020-10-19 MED ORDER — HYDROCODONE-ACETAMINOPHEN 5-325 MG PO TABS: 1.0000 | ORAL_TABLET | Freq: Four times a day (QID) | ORAL | 0 refills | Status: DC | PRN

## 2020-10-19 NOTE — Progress Notes (Signed)
Office Visit Note   Patient: Ashley Wu           Date of Birth: 1951/01/03           MRN: [id number] Visit Date: 10/19/2020 Requested by: Angelica Chessman, MD [address] [place],  [place] [address] PCP: Angelica Chessman, MD  Subjective: Chief Complaint  Patient presents with  . Lower Back - Pain    Pain in the right lower back x 2 weeks. She thinks she may have "stepped the wrong way." Pain is constant. Hurts worse with sitting. Occasionally has pain radiating down the lateral thigh to the foot ("pins and needles" in the right foot). Gabapentin eases the pain some. She says she had a bone density test 10/05/20, showing osteopenia.    HPI: She is here with right-sided low back and leg pain.  About 2 weeks ago she stepped awkwardly and felt immediate pain in her lower back.  Since then she has had intermittent tingling down her right leg.  No weakness, no bowel or bladder dysfunction.  Pain is worse when sitting and better when standing or lying on her left side.  She has gabapentin at home which she takes on an as-needed basis for muscle spasms.  She has tried it for her current symptoms with no relief.  In 2013 I saw her for back pain and ordered an MRI scan.  She had excellent relief with an epidural injection after that, and has not had any troubles until recently.  She does have a history of osteopenia.  No history of compression fractures.                ROS: No rash.  All other systems were reviewed and are negative.  Objective: Vital Signs: There were no vitals taken for this visit.  Physical Exam:  General:  Alert and oriented, in no acute distress. Pulm:  Breathing unlabored. Psy:  Normal mood, congruent affect. Skin: No rash or bruising Low back: Minimal bony tenderness.  She has tightness and tenderness along the right quadratus lumborum muscle and in the gluteus medius area.  No pain with internal hip rotation or with straight leg raise.  Lower  extremity strength and reflexes are normal bilaterally.  Imaging: No results found.  Assessment & Plan: 1.  Flareup of low back pain with right-sided sciatica -Discussed options with her, she wants to have another epidural injection since she did so well the first time.  Hydrocodone as needed for pain.  If she fails to improve, then x-rays and a new MRI scan versus a trial of physical therapy.     Procedures: No procedures performed        PMFS History: Patient Active Problem List   Diagnosis Date Noted  . Essential hypertension 10/18/2014   Past Medical History:  Diagnosis Date  . Arthritis   . Bronchitis   . Osteoarthritis     Family History  Problem Relation Age of Onset  . Cancer Father     Past Surgical History:  Procedure Laterality Date  . MOUTH SURGERY     Social History   Occupational History  . Not on file  Tobacco Use  . Smoking status: Never Smoker  . Smokeless tobacco: Never Used  Substance and Sexual Activity  . Alcohol use: Yes    Comment: occ  . Drug use: No  . Sexual activity: Not on file

## 2020-10-24 ENCOUNTER — Other Ambulatory Visit: Payer: Self-pay | Admitting: Family Medicine

## 2020-10-24 DIAGNOSIS — M5441 Lumbago with sciatica, right side: Secondary | ICD-10-CM

## 2020-10-24 NOTE — Progress Notes (Signed)
Left message on voice mail to call back (no name on the message, no DPR in chart).

## 2020-10-26 ENCOUNTER — Telehealth: Payer: Self-pay | Admitting: Family Medicine

## 2020-10-26 NOTE — Telephone Encounter (Signed)
Patient called declining physical therapy. Patient states she has had therapy before and it did not help. Please call patient at [phone].

## 2020-10-27 NOTE — Telephone Encounter (Signed)
Please advise 

## 2020-10-30 NOTE — Telephone Encounter (Signed)
I called the patient. She has seen a chiropractor in the  past -- it did help her. She said she will call the office where she went before and schedule an appointment. The patient has scheduled an appointment here this Wednesday afternoon with Dr. Prince Rome to have her knee evaluated.

## 2020-10-30 NOTE — Progress Notes (Signed)
Duplicate message - there is another message from the patient, regarding PT.

## 2020-11-01 ENCOUNTER — Ambulatory Visit (INDEPENDENT_AMBULATORY_CARE_PROVIDER_SITE_OTHER): Payer: Medicare HMO

## 2020-11-01 ENCOUNTER — Other Ambulatory Visit: Payer: Self-pay

## 2020-11-01 ENCOUNTER — Encounter: Payer: Self-pay | Admitting: Family Medicine

## 2020-11-01 ENCOUNTER — Ambulatory Visit (INDEPENDENT_AMBULATORY_CARE_PROVIDER_SITE_OTHER): Payer: Medicare HMO | Admitting: Family Medicine

## 2020-11-01 DIAGNOSIS — M5441 Lumbago with sciatica, right side: Secondary | ICD-10-CM | POA: Diagnosis not present

## 2020-11-01 DIAGNOSIS — M25562 Pain in left knee: Secondary | ICD-10-CM

## 2020-11-01 NOTE — Progress Notes (Signed)
   Office Visit Note   Patient: Ashley Wu           Date of Birth: 01-25-1951           MRN: [id number] Visit Date: 11/01/2020 Requested by: Angelica Chessman, MD [address] [place],  [place] [address] PCP: Angelica Chessman, MD  Subjective: Chief Complaint  Patient presents with  . Left Knee - Pain    Pain and swelling in the knee since her last visit here on 10/19/20. Hurts to bend the knee. NKI    HPI: She is here with left knee pain.  Also having persistent low back pain.  The left knee started hurting and swelling the day after she fell.  She is seeing a chiropractor and her chiropractor wants her to have some x-rays of her lumbar spine.              ROS:   All other systems were reviewed and are negative.  Objective: Vital Signs: There were no vitals taken for this visit.  Physical Exam:  General:  Alert and oriented, in no acute distress. Pulm:  Breathing unlabored. Psy:  Normal mood, congruent affect.  Left knee: 2+ effusion with no warmth.  Full active extension, flexion limited to about 45 degrees before pain limits further bending.  Ligaments feel stable.  Imaging: XR Knee 1-2 Views Left  Result Date: 11/01/2020 X-rays of the left knee reveal mild to moderate tricompartmental degenerative change with no sign of fracture.  XR Lumbar Spine 2-3 Views  Result Date: 11/01/2020 Lumbar x-rays reveal anterolisthesis of L3 on L4 grade 1, with facet arthropathy at multiple levels.   Assessment & Plan: 1.  Left knee effusion with underlying DJD, cannot rule out meniscus tear. -Elected to aspirate and inject.  If this does not help, then MRI scan.  2.  Low back pain with degenerative disc disease and facet arthropathy -Continue with chiropractic.     Procedures: Left knee aspiration and injection: After sterile prep with Betadine, injected 4 cc 0.25% bupivacaine then aspirated 50 cc of clear yellow synovial fluid, then injected 40 mg  Depo-Medrol from superolateral approach.       PMFS History: Patient Active Problem List   Diagnosis Date Noted  . Essential hypertension 10/18/2014   Past Medical History:  Diagnosis Date  . Arthritis   . Bronchitis   . Osteoarthritis     Family History  Problem Relation Age of Onset  . Cancer Father     Past Surgical History:  Procedure Laterality Date  . MOUTH SURGERY     Social History   Occupational History  . Not on file  Tobacco Use  . Smoking status: Never Smoker  . Smokeless tobacco: Never Used  Substance and Sexual Activity  . Alcohol use: Yes    Comment: occ  . Drug use: No  . Sexual activity: Not on file

## 2020-12-19 ENCOUNTER — Other Ambulatory Visit: Payer: Self-pay

## 2020-12-19 ENCOUNTER — Ambulatory Visit (INDEPENDENT_AMBULATORY_CARE_PROVIDER_SITE_OTHER): Payer: Medicare HMO | Admitting: Family Medicine

## 2020-12-19 ENCOUNTER — Encounter: Payer: Self-pay | Admitting: Family Medicine

## 2020-12-19 DIAGNOSIS — M25562 Pain in left knee: Secondary | ICD-10-CM

## 2020-12-19 NOTE — Progress Notes (Signed)
I saw and examined the patient with Dr. Marga Hoots and agree with assessment and plan as outlined.    Left knee no better after injection.  Catching on lateral aspect.  Exam reveals effusion and lateral joint line tenderness.  Will proceed with MRI.

## 2020-12-19 NOTE — Progress Notes (Signed)
   Office Visit Note   Patient: Ashley Wu           Date of Birth: 08/20/1950           MRN: [id number] Visit Date: 12/19/2020 Requested by: Angelica Chessman, MD [address] [place],  [place] [address] PCP: Angelica Chessman, MD  Subjective: Chief Complaint  Patient presents with   Left Knee - Edema, Pain    States that it is like it was the last time, states that it did ok for a while, but now she can't the swelling down in it.;p    HPI: 70yo F presenting to clinic with concerns of ongoing left knee pain, swelling. Underwent injection a little over one month ago, and states this failed to improve her pain. She says her knee 'catches' regularly, and is particularly painful over the posterior and lateral aspect with flexion. She tried compression and elevation, and despite this, her knee was very quick to become very swollen after previous aspiration. She has no other concerns today.    Objective: Vital Signs: There were no vitals taken for this visit.  Physical Exam:  General:  Alert and oriented, in no acute distress. Pulm:  Breathing unlabored. Psy:  Normal mood, congruent affect. Skin:  Left knee with no bruising, rashes, or erythema. Overlying skin intact.   Left knee exam:  General: Antalgic gait, favoring left leg.   Seated Exam:  Moderate patellar crepitus, Negative J-Sign.   Palpation: Endorses tenderness to palpation along lateral>> medial joint lines. No pain with patellar compression. Tenderness along distal quads.   Supine exam: Moderate effusion, normal patellar mobility.   Ligamentous Exam:  No pain or laxity with anterior/posterior drawer.  No obvious Sag.  No pain though does have puesdolaxity with varus stress across the knee.   Meniscus:  McMurray with pain along lateral aspect.   Strength: Hip flexion (L1), Hip Aduction (L2), Knee Extension (L3) are 5/5 Bilaterally Foot Inversion (L4), Dorsiflexion (L5), and Eversion (S1) 5/5  Bilaterally  Sensation: Intact to light touch medial and lateral aspects of lower extremities, and lateral, dorsal, and medial aspects of foot.    Imaging: None Today.   Assessment & Plan: 70yo F presenting to clinic with ongoing left knee pain, swelling, which did not improve with aspiration and steroid injection. Given failure of injection therapy, report of catching/locking, will order MRI to better evaluate for possible underlying meniscal pathology. Patient expresses understanding, with no additional concerns today.      Procedures: No procedures performed        PMFS History: Patient Active Problem List   Diagnosis Date Noted   Essential hypertension 10/18/2014   Past Medical History:  Diagnosis Date   Arthritis    Bronchitis    Osteoarthritis     Family History  Problem Relation Age of Onset   Cancer Father     Past Surgical History:  Procedure Laterality Date   MOUTH SURGERY     Social History   Occupational History   Not on file  Tobacco Use   Smoking status: Never   Smokeless tobacco: Never  Substance and Sexual Activity   Alcohol use: Yes    Comment: occ   Drug use: No   Sexual activity: Not on file

## 2021-01-26 ENCOUNTER — Ambulatory Visit
Admission: RE | Admit: 2021-01-26 | Discharge: 2021-01-26 | Disposition: A | Discharge status: Home / Self Care | Source: Ambulatory Visit | Attending: Family Medicine | Admitting: Family Medicine

## 2021-01-26 DIAGNOSIS — M25562 Pain in left knee: Secondary | ICD-10-CM

## 2021-01-29 ENCOUNTER — Telehealth: Payer: Self-pay | Admitting: Family Medicine

## 2021-01-29 MED ORDER — HYDROCODONE-ACETAMINOPHEN 5-325 MG PO TABS: 1.0000 | ORAL_TABLET | Freq: Four times a day (QID) | ORAL | 0 refills | Status: AC | PRN

## 2021-01-29 NOTE — Addendum Note (Signed)
Addended by: Lillia Carmel on: 01/29/2021 02:05 PM   Modules accepted: Orders

## 2021-01-29 NOTE — Telephone Encounter (Signed)
MRI shows a badly torn lateral meniscus cartilage, as well as a lot of arthritis in the knee.  There's a possibility that knee replacement may be needed.  But it's also possible that arthroscopic treatment could be tried.  Best to discuss with a Careers adviser.  Would suggest meeting with Dr. August Saucer.

## 2021-01-29 NOTE — Telephone Encounter (Signed)
I called and left message that this was done.

## 2021-01-29 NOTE — Telephone Encounter (Signed)
I called and advised he patient of her results. Scheduled an appointment for 02/07/21 at 8:30 with Dr. August Saucer (next available). She has just been icing the knee and wearing a compression sleeve for pain. Can she have a refill on the hydrocodone (last filled in April)? [facility].

## 2021-02-07 ENCOUNTER — Telehealth: Payer: Self-pay

## 2021-02-07 ENCOUNTER — Ambulatory Visit (INDEPENDENT_AMBULATORY_CARE_PROVIDER_SITE_OTHER): Payer: Medicare HMO | Admitting: Orthopedic Surgery

## 2021-02-07 ENCOUNTER — Other Ambulatory Visit: Payer: Self-pay

## 2021-02-07 DIAGNOSIS — M1712 Unilateral primary osteoarthritis, left knee: Secondary | ICD-10-CM | POA: Diagnosis not present

## 2021-02-07 NOTE — Telephone Encounter (Signed)
Can we get patient approved for gel injection of left knee. Thanks.

## 2021-02-09 NOTE — Telephone Encounter (Signed)
Noted  

## 2021-02-12 ENCOUNTER — Telehealth: Payer: Self-pay

## 2021-02-12 NOTE — Telephone Encounter (Signed)
VOB submitted for synviscOne, left knee. Pending BV.

## 2021-02-14 ENCOUNTER — Encounter: Payer: Self-pay | Admitting: Orthopedic Surgery

## 2021-02-14 NOTE — Progress Notes (Signed)
Office Visit Note   Patient: Ashley Wu           Date of Birth: 1950/12/24           MRN: [id number] Visit Date: 02/07/2021 Requested by: Angelica Chessman, MD [address] [place],  [place] [address] PCP: Angelica Chessman, MD  Subjective: Chief Complaint  Patient presents with   Left Knee - Pain    HPI: Ashley Wu is a 70 year old patient with left knee pain.  She has had pain for 2 months.  Denies any discrete injury.  Reports constant pain.  Describes weakness and giving way with standing.  The pain wakes her from sleep at night.  She has received a prescription of opioid.  MRI scan of the left knee is reviewed and it shows tricompartmental cartilage abnormalities most severe in the lateral compartment with full-thickness cartilage loss of the posterior aspect of the lateral tibial plateau.  There is maceration of the lateral meniscus.  Large joint effusion present.  She does use a brace.  She has had a cortisone injection which did not really help her pain much.              ROS: All systems reviewed are negative as they relate to the chief complaint within the history of present illness.  Patient denies  fevers or chills.   Assessment & Plan: Visit Diagnoses:  1. Arthritis of left knee     Plan: Impression is significant arthritis and joint space narrowing in the lateral compartment in a patient who has had a prior cortisone injection.  I do not think arthroscopic meniscal debridement would be predictably helpful due to the amount of arthritis present.  She is having primarily pain and not really locking symptoms.  We discussed operative and nonoperative treatment options.  Plan at this time is that she wants to avoid knee replacement.  We will try to preapproved her for gel injection and see her back in 3 weeks. This patient is diagnosed with osteoarthritis of the knee(s).    Radiographs show evidence of joint space narrowing, osteophytes, subchondral  sclerosis and/or subchondral cysts.  This patient has knee pain which interferes with functional and activities of daily living.    This patient has experienced inadequate response, adverse effects and/or intolerance with conservative treatments such as acetaminophen, NSAIDS, topical creams, physical therapy or regular exercise, knee bracing and/or weight loss.   This patient has experienced inadequate response or has a contraindication to intra articular steroid injections for at least 3 months.   This patient is not scheduled to have a total knee replacement within 6 months of starting treatment with viscosupplementation.   Follow-Up Instructions: Return in about 3 weeks (around 02/28/2021).   Orders:  No orders of the defined types were placed in this encounter.  No orders of the defined types were placed in this encounter.     Procedures: No procedures performed   Clinical Data: No additional findings.  Objective: Vital Signs: There were no vitals taken for this visit.  Physical Exam:   Constitutional: Patient appears well-developed HEENT:  Head: Normocephalic Eyes:EOM are normal Neck: Normal range of motion Cardiovascular: Normal rate Pulmonary/chest: Effort normal Neurologic: Patient is alert Skin: Skin is warm Psychiatric: Patient has normal mood and affect   Ortho Exam: Ortho exam demonstrates full active and passive range of motion of the hip and ankle on the left.  She has lateral greater than medial joint line tenderness.  Effusion  is present.  Extensor mechanism is intact.  No groin pain with internal or external Tatian of the leg.  No nerve root tension signs.  Pedal pulses palpable.  Ankle dorsiflexion intact.  Q angle not increased.  Patella tracking normal.  Specialty Comments:  No specialty comments available.  Imaging: No results found.   PMFS History: Patient Active Problem List   Diagnosis Date Noted   Essential hypertension 10/18/2014   Past  Medical History:  Diagnosis Date   Arthritis    Bronchitis    Osteoarthritis     Family History  Problem Relation Age of Onset   Cancer Father     Past Surgical History:  Procedure Laterality Date   MOUTH SURGERY     Social History   Occupational History   Not on file  Tobacco Use   Smoking status: Never   Smokeless tobacco: Never  Substance and Sexual Activity   Alcohol use: Yes    Comment: occ   Drug use: No   Sexual activity: Not on file

## 2021-02-27 ENCOUNTER — Telehealth: Payer: Self-pay

## 2021-02-27 NOTE — Telephone Encounter (Signed)
Received BV today for SynviscOne, left knee. PA required for SynviscOne. Faxed completed PA form to [place] at [phone].

## 2021-02-28 ENCOUNTER — Other Ambulatory Visit: Payer: Self-pay

## 2021-02-28 ENCOUNTER — Encounter: Payer: Self-pay | Admitting: Orthopedic Surgery

## 2021-02-28 ENCOUNTER — Ambulatory Visit (INDEPENDENT_AMBULATORY_CARE_PROVIDER_SITE_OTHER): Payer: Medicare HMO | Admitting: Orthopedic Surgery

## 2021-02-28 ENCOUNTER — Telehealth: Payer: Self-pay

## 2021-02-28 DIAGNOSIS — M1712 Unilateral primary osteoarthritis, left knee: Secondary | ICD-10-CM

## 2021-02-28 MED ORDER — HYLAN G-F 20 48 MG/6ML IX SOSY
48.0000 mg | PREFILLED_SYRINGE | INTRA_ARTICULAR | Status: AC | PRN
  Administered 2021-02-28: 48 mg via INTRA_ARTICULAR

## 2021-02-28 MED ORDER — LIDOCAINE HCL 1 % IJ SOLN
5.0000 mL | INTRAMUSCULAR | Status: AC | PRN
  Administered 2021-02-28: 5 mL

## 2021-02-28 NOTE — Telephone Encounter (Signed)
Approved for SynviscOne, left knee. Buy & Bill Covered at 100% through [employer] after primary pays, Co-pay of $20.00 PA Approval# [id number] Valid 02/27/2021- 05/29/2021

## 2021-02-28 NOTE — Progress Notes (Signed)
   Procedure Note  Patient: Ashley Wu             Date of Birth: 04-09-1951           MRN: [id number]             Visit Date: 02/28/2021  Procedures: Visit Diagnoses:  1. Arthritis of left knee     Large Joint Inj: L knee on 02/28/2021 11:15 AM Indications: pain, joint swelling and diagnostic evaluation Details: 18 G 1.5 in needle, superolateral approach  Arthrogram: No  Medications: 5 mL lidocaine 1 %; 48 mg Hylan 48 MG/6ML Outcome: tolerated well, no immediate complications Procedure, treatment alternatives, risks and benefits explained, specific risks discussed. Consent was given by the patient. Immediately prior to procedure a time out was called to verify the correct patient, procedure, equipment, support staff and site/side marked as required. Patient was prepped and draped in the usual sterile fashion.

## 2021-08-08 ENCOUNTER — Emergency Department (HOSPITAL_BASED_OUTPATIENT_CLINIC_OR_DEPARTMENT_OTHER): Payer: Medicare HMO

## 2021-08-08 ENCOUNTER — Other Ambulatory Visit: Payer: Self-pay

## 2021-08-08 ENCOUNTER — Emergency Department (HOSPITAL_BASED_OUTPATIENT_CLINIC_OR_DEPARTMENT_OTHER)
Admission: EM | Admit: 2021-08-08 | Discharge: 2021-08-08 | Disposition: A | Discharge status: Home / Self Care | Payer: Medicare HMO | Attending: Emergency Medicine | Admitting: Emergency Medicine

## 2021-08-08 ENCOUNTER — Encounter (HOSPITAL_BASED_OUTPATIENT_CLINIC_OR_DEPARTMENT_OTHER): Payer: Self-pay

## 2021-08-08 DIAGNOSIS — R519 Headache, unspecified: Secondary | ICD-10-CM | POA: Insufficient documentation

## 2021-08-08 DIAGNOSIS — Y92410 Unspecified street and highway as the place of occurrence of the external cause: Secondary | ICD-10-CM | POA: Diagnosis not present

## 2021-08-08 DIAGNOSIS — M549 Dorsalgia, unspecified: Secondary | ICD-10-CM | POA: Insufficient documentation

## 2021-08-08 DIAGNOSIS — Z79899 Other long term (current) drug therapy: Secondary | ICD-10-CM | POA: Insufficient documentation

## 2021-08-08 DIAGNOSIS — R079 Chest pain, unspecified: Secondary | ICD-10-CM | POA: Diagnosis not present

## 2021-08-08 DIAGNOSIS — S161XXA Strain of muscle, fascia and tendon at neck level, initial encounter: Secondary | ICD-10-CM | POA: Diagnosis not present

## 2021-08-08 DIAGNOSIS — S199XXA Unspecified injury of neck, initial encounter: Secondary | ICD-10-CM | POA: Diagnosis present

## 2021-08-08 LAB — BASIC METABOLIC PANEL
Anion gap: 15 (ref 5–15)
BUN: 8 mg/dL (ref 8–23)
CO2: 21 mmol/L — ABNORMAL LOW (ref 22–32)
Calcium: 9 mg/dL (ref 8.9–10.3)
Chloride: 101 mmol/L (ref 98–111)
Creatinine, Ser: 0.81 mg/dL (ref 0.44–1.00)
GFR, Estimated: >60 mL/min (ref >60)
Glucose, Bld: 116 mg/dL — ABNORMAL HIGH (ref 70–99)
Potassium: 3.2 mmol/L — ABNORMAL LOW (ref 3.5–5.1)
Sodium: 137 mmol/L (ref 135–145)

## 2021-08-08 LAB — CBC WITH DIFFERENTIAL/PLATELET
Abs Immature Granulocytes: 0.03 10*3/uL (ref 0.00–0.07)
Basophils Absolute: 0.1 10*3/uL (ref 0.0–0.1)
Basophils Relative: 1 %
Eosinophils Absolute: 0.3 10*3/uL (ref 0.0–0.5)
Eosinophils Relative: 4 %
HCT: 52 % — ABNORMAL HIGH (ref 36.0–46.0)
Hemoglobin: 17.4 g/dL — ABNORMAL HIGH (ref 12.0–15.0)
Immature Granulocytes: 1 %
Lymphocytes Relative: 25 %
Lymphs Abs: 1.7 10*3/uL (ref 0.7–4.0)
MCH: 29.3 pg (ref 26.0–34.0)
MCHC: 33.5 g/dL (ref 30.0–36.0)
MCV: 87.7 fL (ref 80.0–100.0)
Monocytes Absolute: 0.5 10*3/uL (ref 0.1–1.0)
Monocytes Relative: 7 %
Neutro Abs: 4.1 10*3/uL (ref 1.7–7.7)
Neutrophils Relative %: 62 %
Platelets: 212 10*3/uL (ref 150–400)
RBC: 5.93 MIL/uL — ABNORMAL HIGH (ref 3.87–5.11)
RDW: 15.1 % (ref 11.5–15.5)
WBC: 6.6 10*3/uL (ref 4.0–10.5)
nRBC: 0 % (ref 0.0–0.2)

## 2021-08-08 MED ORDER — CYCLOBENZAPRINE HCL 10 MG PO TABS: 10.0000 mg | ORAL_TABLET | Freq: Two times a day (BID) | ORAL | 0 refills | Status: AC | PRN

## 2021-08-08 MED ORDER — IOHEXOL 300 MG/ML  SOLN
100.0000 mL | Freq: Once | INTRAMUSCULAR | Status: AC | PRN
  Administered 2021-08-08: 100 mL via INTRAVENOUS

## 2021-08-08 MED ORDER — OXYCODONE-ACETAMINOPHEN 5-325 MG PO TABS
1.0000 | ORAL_TABLET | Freq: Once | ORAL | Status: AC
  Administered 2021-08-08: 1 via ORAL
  Filled 2021-08-08: qty 1

## 2021-08-08 NOTE — Discharge Instructions (Addendum)
You will be sore in the coming days.  Please take 650 mg of Tylenol every 6-8 hours.  You can also take Flexeril as needed for muscle spasms.  Please follow-up with your primary care provider for further evaluation.  Return to the emergency department sooner for worsening symptoms.

## 2021-08-08 NOTE — ED Notes (Signed)
Pt ambulatory to restroom with independent steady gait °
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
## 2021-08-08 NOTE — ED Notes (Signed)
C-collar had been removed by PA. Pt A&OX4 ambulatory at d/c with independent steady gait. Pt verbalized understanding of d/c instructions, prescription and follow up care.

## 2021-12-10 ENCOUNTER — Other Ambulatory Visit: Payer: Self-pay

## 2021-12-10 ENCOUNTER — Emergency Department (HOSPITAL_BASED_OUTPATIENT_CLINIC_OR_DEPARTMENT_OTHER)
Admission: EM | Admit: 2021-12-10 | Discharge: 2021-12-10 | Disposition: A | Discharge status: Home / Self Care | Payer: Medicare HMO | Attending: Emergency Medicine | Admitting: Emergency Medicine

## 2021-12-10 ENCOUNTER — Encounter (HOSPITAL_BASED_OUTPATIENT_CLINIC_OR_DEPARTMENT_OTHER): Payer: Self-pay | Admitting: Emergency Medicine

## 2021-12-10 ENCOUNTER — Emergency Department (HOSPITAL_BASED_OUTPATIENT_CLINIC_OR_DEPARTMENT_OTHER): Payer: Medicare HMO

## 2021-12-10 DIAGNOSIS — S61451A Open bite of right hand, initial encounter: Secondary | ICD-10-CM | POA: Insufficient documentation

## 2021-12-10 DIAGNOSIS — W540XXA Bitten by dog, initial encounter: Secondary | ICD-10-CM | POA: Diagnosis not present

## 2021-12-10 DIAGNOSIS — Z79899 Other long term (current) drug therapy: Secondary | ICD-10-CM | POA: Insufficient documentation

## 2021-12-10 DIAGNOSIS — M79641 Pain in right hand: Secondary | ICD-10-CM

## 2021-12-10 DIAGNOSIS — I10 Essential (primary) hypertension: Secondary | ICD-10-CM | POA: Insufficient documentation

## 2021-12-10 DIAGNOSIS — S6991XA Unspecified injury of right wrist, hand and finger(s), initial encounter: Secondary | ICD-10-CM | POA: Diagnosis present

## 2021-12-10 MED ORDER — OXYCODONE-ACETAMINOPHEN 5-325 MG PO TABS
1.0000 | ORAL_TABLET | Freq: Once | ORAL | Status: AC
  Administered 2021-12-10: 1 via ORAL
  Filled 2021-12-10: qty 1

## 2021-12-10 MED ORDER — AMOXICILLIN-POT CLAVULANATE 875-125 MG PO TABS: 1.0000 | ORAL_TABLET | Freq: Two times a day (BID) | ORAL | 0 refills | Status: AC

## 2021-12-10 MED ORDER — AMOXICILLIN-POT CLAVULANATE 875-125 MG PO TABS
1.0000 | ORAL_TABLET | Freq: Once | ORAL | Status: AC
Start: 2021-12-10 — End: 2021-12-10
  Administered 2021-12-10: 1 via ORAL
  Filled 2021-12-10: qty 1

## 2021-12-10 NOTE — Discharge Instructions (Addendum)
We are treating you for prevention of a hand infection.  I have sent in Augmentin to your pharmacy.  Please take this as prescribed.  I have also provided you with a referral to hand surgery.  Please call this number for follow-up visit.  If you develop any worsening symptoms while on antibiotics such as fevers, redness, worsening swelling of your hand, or abnormal drainage from the wounds on your hand please return to the emergency department for reevaluation.

## 2021-12-10 NOTE — ED Provider Notes (Signed)
[facility] EMERGENCY DEPARTMENT Provider Note   CSN: [id number] Arrival date & time: 12/10/21  1245     History PMH: bronchitis, OA, HTN Chief Complaint  Patient presents with   Animal Bite    Ashley Wu is a 71 y.o. female. Presents to the emergency department after an animal bite that occurred last night.  Says that her neighbors dog bit her in the right hand.  She states that the dog is vaccinated and that she is up-to-date on her tetanus shot.  Says she did not really think much of it last night, but this morning she woke up with swelling over her middle knuckle and difficulty moving her hand.  She has not had any fevers or chills.  Denies numbness or tingling.   Animal Bite Associated symptoms: no fever        Home Medications Prior to Admission medications   Medication Sig Start Date End Date Taking? Authorizing Provider  amoxicillin-clavulanate (AUGMENTIN) 875-125 MG tablet Take 1 tablet by mouth every 12 (twelve) hours for 7 days. 12/10/21 12/17/21 Yes Tyisha Cressy, Finis Bud, PA-C  albuterol (PROVENTIL HFA;VENTOLIN HFA) 108 (90 Base) MCG/ACT inhaler Inhale 2 puffs into the lungs every 6 (six) hours as needed for wheezing or shortness of breath.    [provider]  albuterol (PROVENTIL) (5 MG/ML) 0.5% nebulizer solution Take 0.5 mLs (2.5 mg total) by nebulization every 6 (six) hours as needed for wheezing or shortness of breath. 07/02/19   Fawze, Mina A, PA-C  amLODipine (NORVASC) 2.5 MG tablet Take 2.5 mg by mouth daily.    [provider]  cyclobenzaprine (FLEXERIL) 10 MG tablet Take 1 tablet (10 mg total) by mouth 2 (two) times daily as needed for muscle spasms. 08/08/21   Honor Loh M, PA-C  fluticasone (FLONASE) 50 MCG/ACT nasal spray Place into both nostrils daily.    [provider]  gabapentin (NEURONTIN) 300 MG capsule Take 1 capsule by mouth 3 (three) times daily. 08/11/20   [provider]   HYDROcodone-acetaminophen (NORCO/VICODIN) 5-325 MG tablet Take 1 tablet by mouth every 6 (six) hours as needed for moderate pain. 01/29/21   Hilts, Casimiro Needle, MD  olopatadine (PATANOL) 0.1 % ophthalmic solution Place 1 drop into both eyes 2 times daily. 09/28/20   [provider]  pravastatin (PRAVACHOL) 10 MG tablet Take 10 mg by mouth at bedtime. Patient not taking: Reported on 10/19/2020 10/11/20   [provider]  Dwyane Luo 200-62.5-25 MCG/INH AEPB  07/06/20   [provider]      Allergies    Advil [ibuprofen], Aspirin, Baker's yeast [yeast], and Orange fruit [citrus]    Review of Systems   Review of Systems  Constitutional:  Negative for chills and fever.  Skin:  Positive for wound.  All other systems reviewed and are negative.   Physical Exam Updated Vital Signs BP (!) 172/85 (BP Location: Right Arm)   Pulse 89   Temp 97.7 F (36.5 C) (Oral)   Resp 16   Ht 5\' 6"  (1.676 m)   Wt 78 kg   SpO2 96%   BMI 27.76 kg/m  Physical Exam Vitals and nursing note reviewed.  Constitutional:      General: She is not in acute distress.    Appearance: Normal appearance. She is well-developed. She is not ill-appearing, toxic-appearing or diaphoretic.  HENT:     Head: Normocephalic and atraumatic.     Nose: No nasal deformity.     Mouth/Throat:  Lips: Pink. No lesions.  Eyes:     General: Gaze aligned appropriately. No scleral icterus.       Right eye: No discharge.        Left eye: No discharge.     Conjunctiva/sclera: Conjunctivae normal.     Right eye: Right conjunctiva is not injected. No exudate or hemorrhage.    Left eye: Left conjunctiva is not injected. No exudate or hemorrhage. Pulmonary:     Effort: Pulmonary effort is normal. No respiratory distress.  Skin:    General: Skin is warm and dry.     Comments: See photo below. Soft tissue swelling overlying the right 3rd MCP with small puncture wound. No abnormal drainage. Not erythematous. Tender  to touch. Small puncture wound on the palmar side of hand as well without soft tissue swelling.  Radial pulse 2 + bilaterally. Sensation intact. Limited ROM of right middle finger. Capillary refill < 2 seconds.   Neurological:     Mental Status: She is alert and oriented to person, place, and time.  Psychiatric:        Mood and Affect: Mood normal.        Speech: Speech normal.        Behavior: Behavior normal. Behavior is cooperative.     ED Results / Procedures / Treatments   Labs (all labs ordered are listed, but only abnormal results are displayed) Labs Reviewed - No data to display  EKG None  Radiology DG Hand Complete Right  Result Date: 12/10/2021 CLINICAL DATA:  Dog bite EXAM: RIGHT HAND - COMPLETE 3+ VIEW COMPARISON:  None Available. FINDINGS: There is no evidence of fracture or dislocation. Mild multifocal degenerative change most notably involving the first [facility] joint. No radiopaque foreign body. IMPRESSION: No acute osseous abnormality and no radiopaque foreign body identified. Electronically Signed   By: Maudry Mayhew M.D.   On: 12/10/2021 09:54    Procedures Procedures   Medications Ordered in ED Medications  amoxicillin-clavulanate (AUGMENTIN) 875-125 MG per tablet 1 tablet (1 tablet Oral Given 12/10/21 1010)  oxyCODONE-acetaminophen (PERCOCET/ROXICET) 5-325 MG per tablet 1 tablet (1 tablet Oral Given 12/10/21 1013)    ED Course/ Medical Decision Making/ A&P                           Medical Decision Making Amount and/or Complexity of Data Reviewed Radiology: ordered.  Risk Prescription drug management.    MDM  This is a 71 y.o. female who presents to the ED with animal bite The differential of this patient includes but is not limited to soft tissue swelling, tenosynovitis, septic joint  Ashley Impression, Plan, and ED Course:  Well-appearing female.  Vitals are stable.  Soft tissue swelling over the third MCP joint without any erythema or drainage.  Cannot  see if wound extends to the tendon. XR and pain  meds given  I personally ordered, reviewed, and interpreted all laboratory work and imaging and agree with radiologist interpretation. Results interpreted below: Xray does not reveal any fb or fracture   At this point, I have lower suspicion for underlying tenosynovitis or septic joint as there is no soft tissue erythema or abnormal drainage, however, this could be an early infection versus soft tissue swelling from wound.  Patient is also hemodynamically stable and has no systemic symptoms.  There is some soft tissue swelling overlying the MCP in the setting of a dog bite less than 24 hours ago.  She  would likely benefit from outpatient antibiotics which we will start here.  She is up-to-date on her tetanus already and dog is vaccinated so no need for rabies prophylaxis.  We will clean the wound here.  She is given strict return cautions of any worsening symptoms while on antibiotics.  She is also given hand referral.  Charting Requirements Additional history is obtained from:  Independent historian External Records from outside source obtained and reviewed including: n/a Social Determinants of Health:  none Pertinant PMH that complicates patient's illness: n/a  Patient Care Problems that were addressed during this visit: - Dog Bite: Acute illness with complication - Right Hand Pain: Acute illness with complication Medications given in ED: Percocet, Augmentin Reevaluation of the patient after these medicines showed that the patient improved I have reviewed home medications and made changes accordingly. Sent in abx Disposition: Discharge  This is a shared visit with Ashley attending physician, Dr. Roslynn Amble.  We have discussed this patient and they have independently evaluated this patient. The plan was altered or changed as needed.  Portions of this note were generated with [employer]. Dictation errors may occur despite best attempts  at proofreading.    Final Clinical Impression(s) / ED Diagnoses Final diagnoses:  Dog bite, initial encounter  Right hand pain    Rx / DC Orders ED Discharge Orders          Ordered    amoxicillin-clavulanate (AUGMENTIN) 875-125 MG tablet  Every 12 hours        12/10/21 1026              Ashkum, Adora Fridge, PA-C 12/10/21 1029    Lucrezia Starch, MD 12/11/21 [phone]

## 2021-12-10 NOTE — ED Triage Notes (Signed)
Dog bite last  rt hand about 9 pm , now  hand is swollen and tender, last tetanus with in last 5 years  per pt , dog  is UTD on shots
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
## 2022-02-19 IMAGING — DX DG LUMBAR SPINE COMPLETE 4+V
5 series · 5 of 5 positions shown · non-contrast
Comparison: Radiograph dated 11/01/2020.

CLINICAL DATA: Back pain.

EXAM:
LUMBAR SPINE - COMPLETE 4+ VIEW

[l-spine ap]
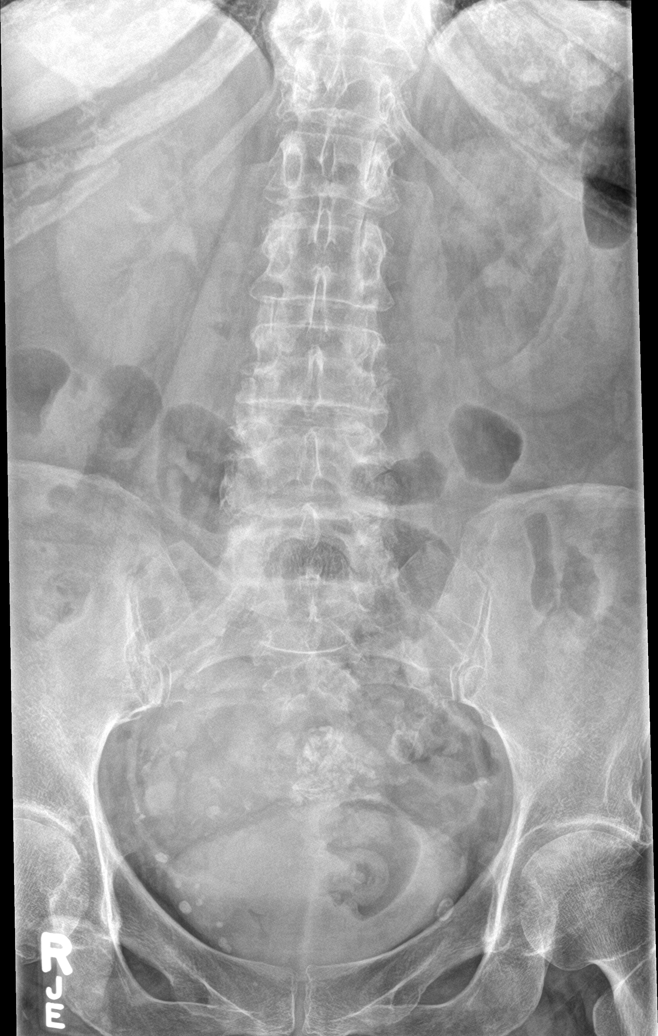

[l-spine obl (1 of 2)]
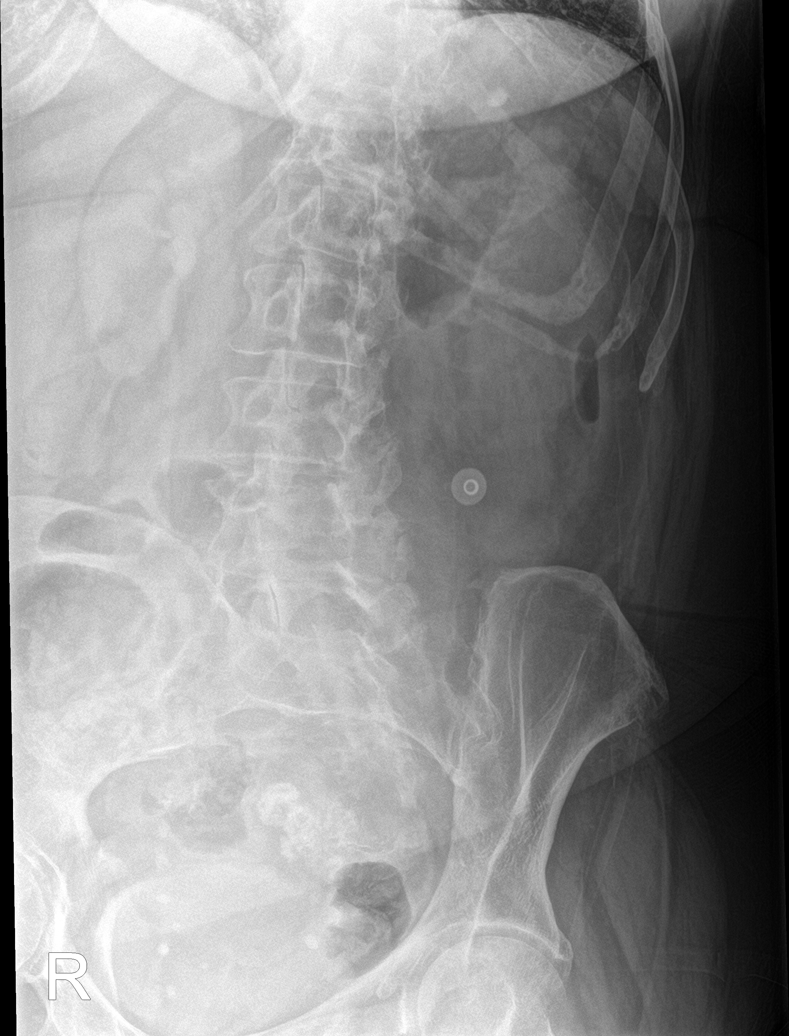

[l-spine obl (2 of 2)]
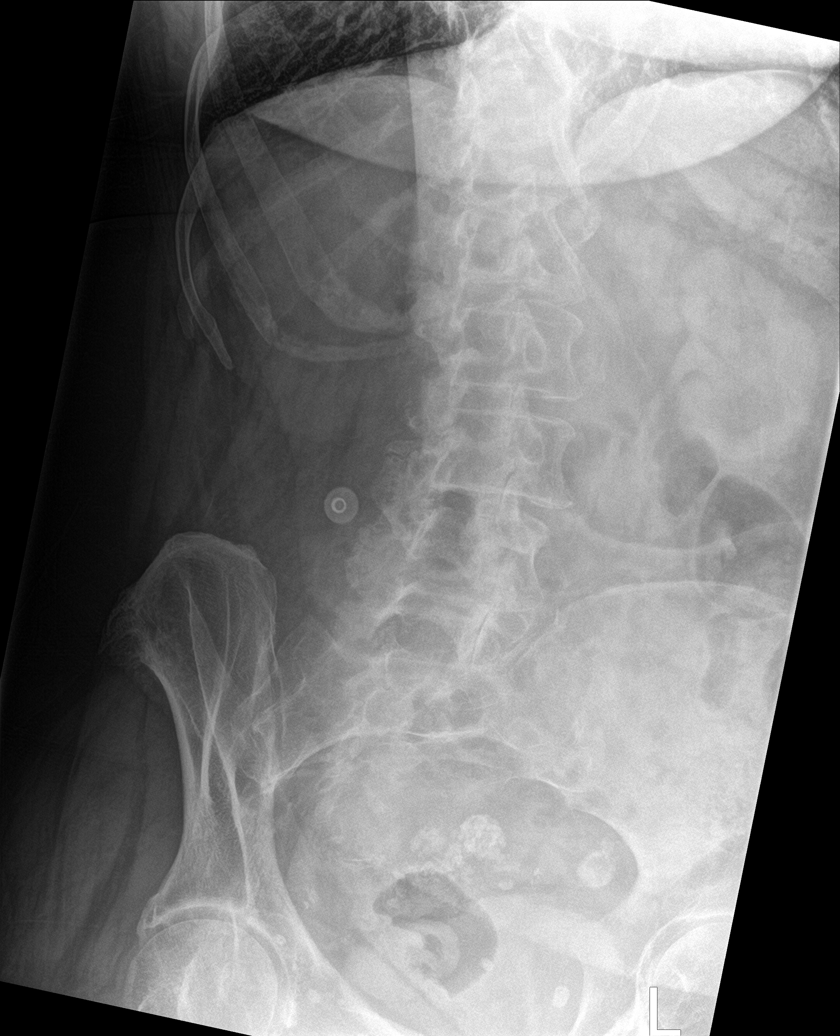

[l-spine lat]
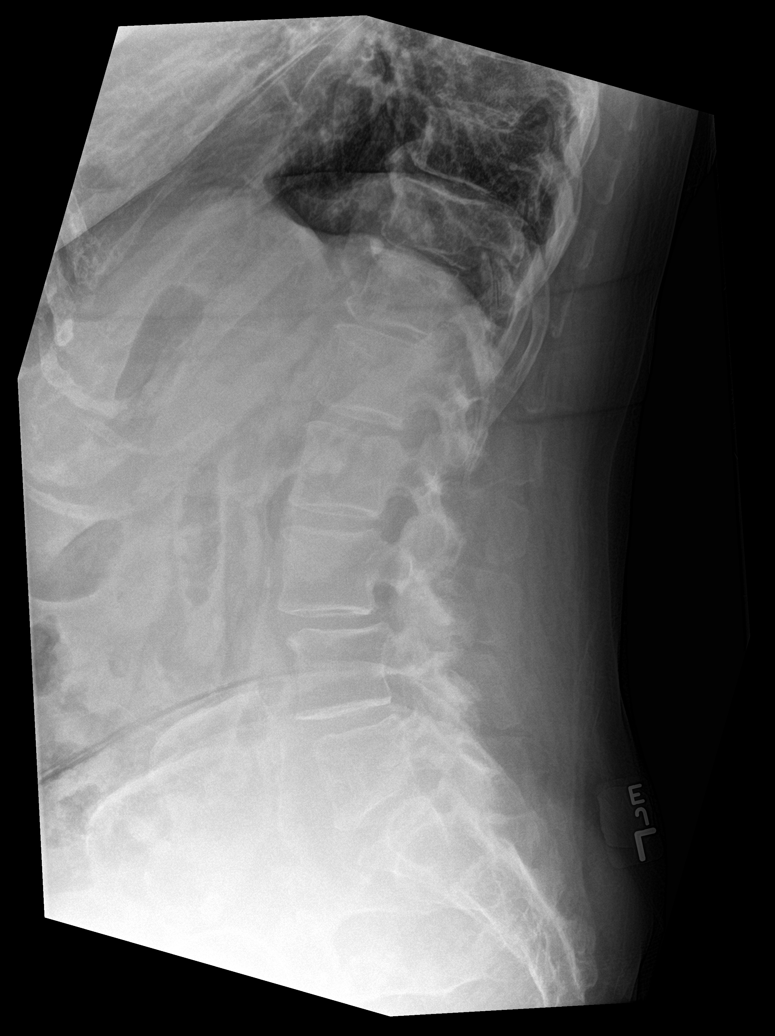

[l-spine spot]
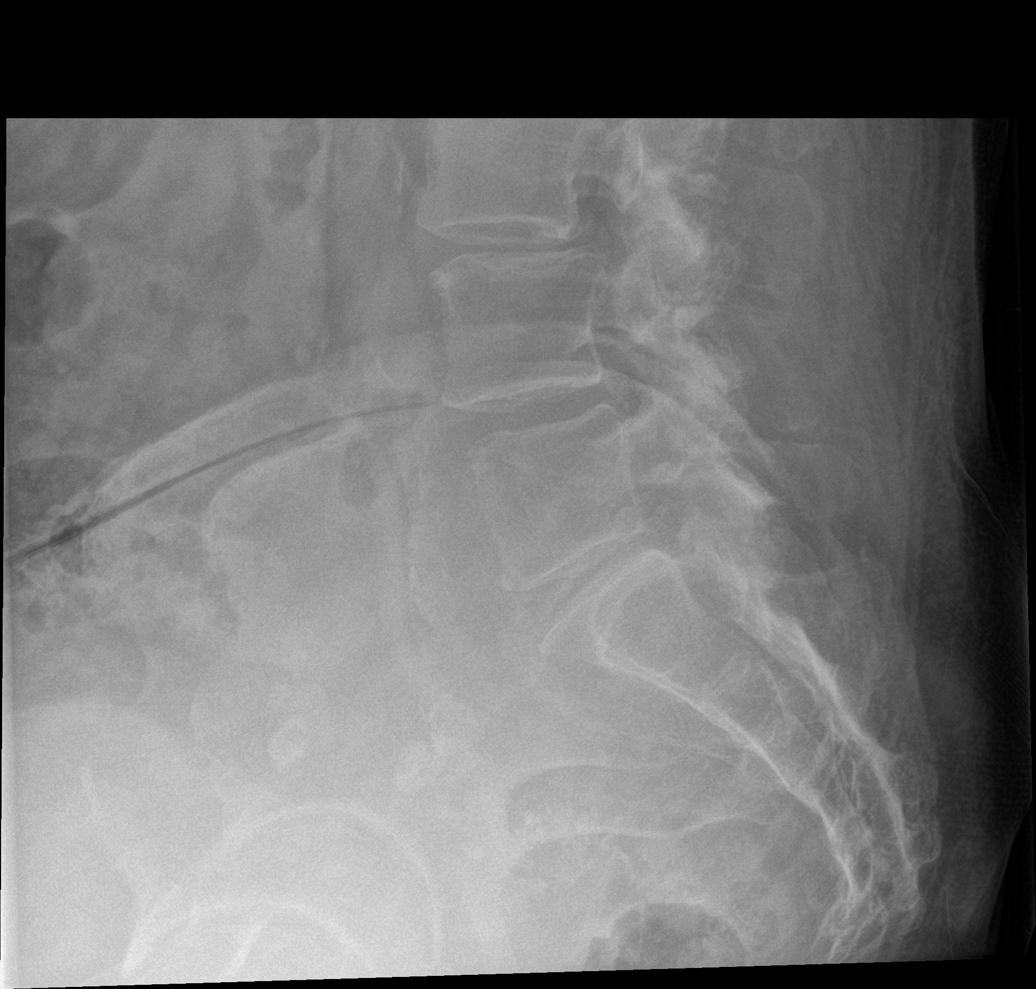

[5 of 5 positions shown; findings below may reference images not displayed]

FINDINGS: Five lumbar type vertebra. There is no acute fracture or subluxation
of the lumbar spine. The bones are osteopenic. Multilevel
degenerative changes and facet arthropathy. Grade 1 L3-L4
anterolisthesis. The visualized posterior elements are intact.
Atherosclerotic calcification of the aorta. Calcified fibroid over
the pelvis. The soft tissues are unremarkable.
IMPRESSION: No acute/traumatic lumbar spine pathology.
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
